# Patient Record
Sex: Female | Born: 1980 | Race: White | Hispanic: No | Marital: Married | Smoking: Never smoker
Health system: Southern US, Community
[De-identification: ages and names within clinical notes are randomized; demographics above are authoritative.]

## PROBLEM LIST (undated history)

## (undated) ENCOUNTER — Inpatient Hospital Stay (HOSPITAL_COMMUNITY): Payer: Self-pay

## (undated) ENCOUNTER — Emergency Department (HOSPITAL_BASED_OUTPATIENT_CLINIC_OR_DEPARTMENT_OTHER): Admission: EM | Payer: 59

## (undated) DIAGNOSIS — R51 Headache: Secondary | ICD-10-CM

## (undated) DIAGNOSIS — R519 Headache, unspecified: Secondary | ICD-10-CM

## (undated) DIAGNOSIS — L509 Urticaria, unspecified: Secondary | ICD-10-CM

## (undated) DIAGNOSIS — M542 Cervicalgia: Secondary | ICD-10-CM

## (undated) DIAGNOSIS — Z9109 Other allergy status, other than to drugs and biological substances: Secondary | ICD-10-CM

## (undated) DIAGNOSIS — I219 Acute myocardial infarction, unspecified: Secondary | ICD-10-CM

## (undated) DIAGNOSIS — J302 Other seasonal allergic rhinitis: Secondary | ICD-10-CM

## (undated) DIAGNOSIS — G8929 Other chronic pain: Secondary | ICD-10-CM

## (undated) DIAGNOSIS — R000 Tachycardia, unspecified: Secondary | ICD-10-CM

## (undated) DIAGNOSIS — M549 Dorsalgia, unspecified: Secondary | ICD-10-CM

## (undated) HISTORY — PX: WISDOM TOOTH EXTRACTION: SHX21

## (undated) HISTORY — DX: Urticaria, unspecified: L50.9

---

## 2008-10-03 ENCOUNTER — Inpatient Hospital Stay (HOSPITAL_COMMUNITY): Admission: AD | Admit: 2008-10-03 | Discharge: 2008-10-03 | Payer: Self-pay | Admitting: Obstetrics and Gynecology

## 2009-02-20 ENCOUNTER — Inpatient Hospital Stay (HOSPITAL_COMMUNITY): Admission: AD | Admit: 2009-02-20 | Discharge: 2009-02-20 | Payer: Self-pay | Admitting: Obstetrics and Gynecology

## 2009-02-22 ENCOUNTER — Inpatient Hospital Stay (HOSPITAL_COMMUNITY): Admission: AD | Admit: 2009-02-22 | Discharge: 2009-02-24 | Payer: Self-pay | Admitting: Obstetrics and Gynecology

## 2011-01-27 LAB — CBC
HCT: 26.1 % — ABNORMAL LOW (ref 36.0–46.0)
HCT: 34.8 % — ABNORMAL LOW (ref 36.0–46.0)
Hemoglobin: 11.9 g/dL — ABNORMAL LOW (ref 12.0–15.0)
Hemoglobin: 12.1 g/dL (ref 12.0–15.0)
MCHC: 34.7 g/dL (ref 30.0–36.0)
MCHC: 35.5 g/dL (ref 30.0–36.0)
MCV: 89.6 fL (ref 78.0–100.0)
MCV: 90.4 fL (ref 78.0–100.0)
RBC: 2.91 MIL/uL — ABNORMAL LOW (ref 3.87–5.11)
RBC: 3.79 MIL/uL — ABNORMAL LOW (ref 3.87–5.11)
RDW: 13.2 % (ref 11.5–15.5)
WBC: 14.1 10*3/uL — ABNORMAL HIGH (ref 4.0–10.5)

## 2011-01-27 LAB — COMPREHENSIVE METABOLIC PANEL
ALT: 21 U/L (ref 0–35)
AST: 26 U/L (ref 0–37)
Alkaline Phosphatase: 122 U/L — ABNORMAL HIGH (ref 39–117)
BUN: 12 mg/dL (ref 6–23)
CO2: 20 mEq/L (ref 19–32)
CO2: 23 mEq/L (ref 19–32)
Calcium: 8.7 mg/dL (ref 8.4–10.5)
Calcium: 9.1 mg/dL (ref 8.4–10.5)
Creatinine, Ser: 0.51 mg/dL (ref 0.4–1.2)
GFR calc Af Amer: >60 mL/min (ref >60)
GFR calc non Af Amer: >60 mL/min (ref >60)
GFR calc non Af Amer: >60 mL/min (ref >60)
Glucose, Bld: 91 mg/dL (ref 70–99)
Glucose, Bld: 96 mg/dL (ref 70–99)
Potassium: 3.9 mEq/L (ref 3.5–5.1)
Sodium: 133 mEq/L — ABNORMAL LOW (ref 135–145)
Sodium: 134 mEq/L — ABNORMAL LOW (ref 135–145)
Total Protein: 6.5 g/dL (ref 6.0–8.3)
Total Protein: 6.7 g/dL (ref 6.0–8.3)

## 2011-01-27 LAB — URIC ACID: Uric Acid, Serum: 6 mg/dL (ref 2.4–7.0)

## 2011-01-27 LAB — LACTATE DEHYDROGENASE
LDH: 122 U/L (ref 94–250)
LDH: 125 U/L (ref 94–250)

## 2011-03-03 NOTE — H&P (Signed)
Stephanie Wagner, Stephanie Wagner              ACCOUNT NO.:  [id number]   MEDICAL RECORD NO.:  [id number]          PATIENT TYPE:  INP   LOCATION:  9169                          FACILITY:  [facility]   PHYSICIAN:  Crist Fat. Rivard, M.D. DATE OF BIRTH:  1981-08-17   DATE OF ADMISSION:  02/22/2009  DATE OF DISCHARGE:                              HISTORY & PHYSICAL   HISTORY:  Ms. Stephanie Wagner is a 30 year old gravida 1, para 0, at 21 weeks who  presents for induction secondary to [facility].  She was seen at the office  today with cervix 3+ centimeters.  She had a pH workup earlier this week  with negative labs.  She declined induction at that time, but consented  today.  Pregnancy has been remarkable for:  1. PIH for the last two weeks.  The patient on labetalol 100 mg p.o.      b.i.d.  2. History of tachycardia with normal cardiac workup.  3. PENICILLIN allergy.  4. Group-B Streptococcus negative.  5. History of previa, resolved after the first trimester.  6. Frequent urinary tract infections.   LABORATORY DATA:  Prenatal labs:  Blood type is  O+, Rh antibody  negative.  VDRL nonreactive.  Rubella titer positive.  Hepatitis-B  surface antigen negative.  HIV is nonreactive.  Toxoplasmosis titers  were negative, negative.  GC and Chlamydia cultures were deferred at the  first trimester.  Pap was normal in March 2009.  TSH was 0.482  initially, with a T4 of 12.2, a T3 of 20.8 and a free thyroid index of  2.5.  This was done in an evaluation of her tachycardia.  She declined a  quadruple screen.  She had a 1-hour Glucola that was normal.  Hemoglobin  at 26 weeks was 10.9.  Group-B strep culture was negative at 36 weeks.  PIH workup was negative earlier this week, and a 24-hour urine is  currently pending.   HISTORY OF PRESENT PREGNANCY:  The patient entered care at 14 weeks.  She did have a first trimester screen that was normal.  She had some  bleeding early in her pregnancy.  She had a placenta previa diagnosed  by  a first trimester ultrasound.  TSH and  toxo-titers were done at her new  OB visit.  TSH was done secondary to some episodes of tachycardia, and  she had some previous evaluations by a cardiologist for that.  She still  had some spotting at 14 weeks.  She declined an H1N1.  She had an  ultrasound at 18 weeks showing normal growth and fluid and an anterior  placenta with a resolution of the previa.  Maternal pulse rate remained  around 100 through her whole pregnancy.  She was on Macrobid prophylaxis  for frequent UTI's.  She had some spotting at 24 weeks.  She was offered  evaluation, but declined.  She had a normal Glucola at 26 weeks and had  a hemoglobin of 10.9.  She had some chest pain at 32 weeks.  This was  evidently more associated with reflux.  Group-B strep culture was done  at 35  weeks and was negative.  She had an elevated blood pressure in the  office earlier this week and was seen in maternity admission. The  cervix that time was 3+, 80%.  PIH workup was within normal limits.  She  was offered induction at that time but she declined.  She then was seen  in the office today, to bring back 24-hour urine, and was off induction  again.  At this point she elected to proceed with induction.   OBSTETRICAL HISTORY:  The patient is a primigravida.   MEDICAL HISTORY:  She is previous __________ user in June 2009.  She  does have one cat. She reports the usual childhood illnesses.  She has  had a cardiologist workup for tachycardia.  All tests have been normal.  She reports anemia as a child.  She has a history of frequent UTI's  right before this pregnancy.   PAST SURGICAL HISTORY:  Wisdom teeth at age 89.   ALLERGIES:  PENICILLIN.   FAMILY HISTORY:  Maternal grandfather had a triple bypass.  Her mother  and maternal aunt and maternal grandmother had varicose veins.  Her  mother has anemia.  Her father has type 2 diabetes, on insulin.  Her  mother is hypothyroid, as well as  her maternal aunt.  Her father had a  kidney transplant.  A paternal uncle also has kidney disease.  Her  mother had breast cancer.  Maternal grandfather had possible testicular  cancer.  Paternal uncle smokes.   GENETIC HISTORY:  Remarkable for father of baby and the father of the  baby's mother having webbed feet.   SOCIAL HISTORY:  The patient is married to the father of baby.  He is  involved and supportive.  His name is [employer].  The patient has a  bachelor's degree.  She is employed as a Teacher, early years/pre.  Her husband is  also Civil Service fast streamer.  He is a Research scientist (physical sciences).  She has  been followed by the physician service of [facility].  She  denies any alcohol, drug or tobacco use during this pregnancy.   PHYSICAL EXAMINATION:  VITAL SIGNS:  Blood pressures 141/98, 139/101  initially, pulse is 101 to 112, respirations of 18.  The patient is  afebrile.  HEENT: Within normal limits.  LUNGS:  Bilateral breath sounds are clear.  HEART:  Regular rate and rhythm without murmur.  BREASTS:  Soft, nontender.  ABDOMEN:  Fundal height is approximately 39 cm.  Estimated fetal weight  7 to 8 pounds.  Uterine contractions every six to eight minutes, mild  quality.  Fetal heart rate is reactive, with no decelerations.  PELVIC EXAM:  Cervix has been 3, 80% vertex, -1 per previous exam.  EXTREMITIES:  Deep tendon reflexes are 2+, without clonus.  There is 1+  edema noted.   IMPRESSION:  1. Intrauterine pregnancy at 39 weeks.  2. Pregnancy-induced hypertension.  3. Favorable cervix.  4. Group-B Streptococcus negative.   PLAN:  1. Admit to birthing suite per consult with Dr. Crist Fat. Rivard as      the attending physician.  2. Routine physician orders.  3. PIH labs are pending with admit labs, and 24-hour urine is also      pending from the office today.  4. Pitocin per low-dose protocol.  5. Dr. Estanislado Pandy will follow.  6. Pain medication p.r.n.     Renaldo Reel  Emilee Hero, C.N.M.      Crist Fat Rivard, M.D.  Electronically  Signed   VLL/MEDQ  D:  02/22/2009  T:  02/22/2009  Job:  [id number]

## 2011-07-24 LAB — URINALYSIS, ROUTINE W REFLEX MICROSCOPIC
Bilirubin Urine: NEGATIVE
Glucose, UA: NEGATIVE mg/dL
Ketones, ur: NEGATIVE mg/dL
Protein, ur: NEGATIVE mg/dL
pH: 6 (ref 5.0–8.0)

## 2011-07-24 LAB — URINE MICROSCOPIC-ADD ON

## 2011-07-24 LAB — WET PREP, GENITAL
Trich, Wet Prep: NONE SEEN
Yeast Wet Prep HPF POC: NONE SEEN

## 2011-07-24 LAB — GC/CHLAMYDIA PROBE AMP, GENITAL
Chlamydia, DNA Probe: NEGATIVE
GC Probe Amp, Genital: NEGATIVE

## 2011-07-24 LAB — URINE CULTURE: Culture: NO GROWTH

## 2011-11-08 ENCOUNTER — Encounter (HOSPITAL_BASED_OUTPATIENT_CLINIC_OR_DEPARTMENT_OTHER): Payer: Self-pay | Admitting: *Deleted

## 2011-11-08 ENCOUNTER — Emergency Department (HOSPITAL_BASED_OUTPATIENT_CLINIC_OR_DEPARTMENT_OTHER)
Admission: EM | Admit: 2011-11-08 | Discharge: 2011-11-08 | Disposition: A | Discharge status: Home / Self Care | Payer: BC Managed Care – PPO | Attending: Emergency Medicine | Admitting: Emergency Medicine

## 2011-11-08 ENCOUNTER — Other Ambulatory Visit: Payer: Self-pay

## 2011-11-08 DIAGNOSIS — G43909 Migraine, unspecified, not intractable, without status migrainosus: Secondary | ICD-10-CM | POA: Insufficient documentation

## 2011-11-08 DIAGNOSIS — IMO0001 Reserved for inherently not codable concepts without codable children: Secondary | ICD-10-CM | POA: Insufficient documentation

## 2011-11-08 DIAGNOSIS — R059 Cough, unspecified: Secondary | ICD-10-CM | POA: Insufficient documentation

## 2011-11-08 DIAGNOSIS — B349 Viral infection, unspecified: Secondary | ICD-10-CM

## 2011-11-08 DIAGNOSIS — M549 Dorsalgia, unspecified: Secondary | ICD-10-CM | POA: Insufficient documentation

## 2011-11-08 DIAGNOSIS — R000 Tachycardia, unspecified: Secondary | ICD-10-CM | POA: Insufficient documentation

## 2011-11-08 DIAGNOSIS — R05 Cough: Secondary | ICD-10-CM | POA: Insufficient documentation

## 2011-11-08 DIAGNOSIS — R197 Diarrhea, unspecified: Secondary | ICD-10-CM | POA: Insufficient documentation

## 2011-11-08 DIAGNOSIS — M542 Cervicalgia: Secondary | ICD-10-CM | POA: Insufficient documentation

## 2011-11-08 HISTORY — DX: Tachycardia, unspecified: R00.0

## 2011-11-08 HISTORY — DX: Headache, unspecified: R51.9

## 2011-11-08 HISTORY — DX: Headache: R51

## 2011-11-08 HISTORY — DX: Cervicalgia: M54.2

## 2011-11-08 HISTORY — DX: Dorsalgia, unspecified: M54.9

## 2011-11-08 HISTORY — DX: Other chronic pain: G89.29

## 2011-11-08 LAB — COMPREHENSIVE METABOLIC PANEL
AST: 23 U/L (ref 0–37)
Albumin: 4.3 g/dL (ref 3.5–5.2)
Alkaline Phosphatase: 85 U/L (ref 39–117)
BUN: 11 mg/dL (ref 6–23)
CO2: 22 mEq/L (ref 19–32)
Chloride: 103 mEq/L (ref 96–112)
GFR calc non Af Amer: >90 mL/min (ref >90)
Potassium: 4.6 mEq/L (ref 3.5–5.1)
Total Bilirubin: 0.2 mg/dL — ABNORMAL LOW (ref 0.3–1.2)

## 2011-11-08 LAB — DIFFERENTIAL
Basophils Relative: 0 % (ref 0–1)
Lymphocytes Relative: 11 % — ABNORMAL LOW (ref 12–46)
Lymphs Abs: 0.6 10*3/uL — ABNORMAL LOW (ref 0.7–4.0)
Monocytes Relative: 1 % — ABNORMAL LOW (ref 3–12)
Neutro Abs: 4.9 10*3/uL (ref 1.7–7.7)
Neutrophils Relative %: 88 % — ABNORMAL HIGH (ref 43–77)

## 2011-11-08 LAB — CBC
HCT: 39.4 % (ref 36.0–46.0)
Hemoglobin: 13.6 g/dL (ref 12.0–15.0)
MCHC: 34.5 g/dL (ref 30.0–36.0)
RBC: 4.77 MIL/uL (ref 3.87–5.11)
WBC: 5.6 10*3/uL (ref 4.0–10.5)

## 2011-11-08 LAB — URINALYSIS, ROUTINE W REFLEX MICROSCOPIC
Glucose, UA: NEGATIVE mg/dL
Hgb urine dipstick: NEGATIVE
Ketones, ur: NEGATIVE mg/dL
Protein, ur: NEGATIVE mg/dL

## 2011-11-08 MED ORDER — PROMETHAZINE HCL 25 MG/ML IJ SOLN
25.0000 mg | Freq: Once | INTRAMUSCULAR | Status: AC
  Administered 2011-11-08: 25 mg via INTRAVENOUS
  Filled 2011-11-08: qty 1

## 2011-11-08 MED ORDER — LORAZEPAM 2 MG/ML IJ SOLN
0.5000 mg | Freq: Once | INTRAMUSCULAR | Status: AC
  Administered 2011-11-08: 0.5 mg via INTRAVENOUS
  Filled 2011-11-08: qty 1

## 2011-11-08 MED ORDER — DIPHENHYDRAMINE HCL 50 MG/ML IJ SOLN
12.5000 mg | Freq: Once | INTRAMUSCULAR | Status: AC
  Administered 2011-11-08: 12.5 mg via INTRAVENOUS
  Filled 2011-11-08: qty 1

## 2011-11-08 MED ORDER — METHYLPREDNISOLONE SODIUM SUCC 125 MG IJ SOLR
125.0000 mg | Freq: Once | INTRAMUSCULAR | Status: AC
  Administered 2011-11-08: 125 mg via INTRAVENOUS
  Filled 2011-11-08: qty 2

## 2011-11-08 MED ORDER — HYDROMORPHONE HCL PF 1 MG/ML IJ SOLN
1.0000 mg | Freq: Once | INTRAMUSCULAR | Status: AC
  Administered 2011-11-08: 1 mg via INTRAVENOUS
  Filled 2011-11-08: qty 1

## 2011-11-08 NOTE — ED Provider Notes (Signed)
History   This chart was scribed for Stephanie Quarry, MD by Melba Coon. The patient was seen in room MH06/MH06 and the patient's care was started at 7:25PM.    CSN: [id number]  Arrival date & time 11/08/11  4098   First MD Initiated Contact with Patient 11/08/11 1904      Chief Complaint  Patient presents with  . Tachycardia    (Consider location/radiation/quality/duration/timing/severity/associated sxs/prior treatment) HPI Stephanie Wagner is a 31 y.o. female who presents to the Emergency Department complaining of constant moderate to severe cold-like symptoms with an onset 3 days ago. Pt was afraid it might be the flu because she has a daughter at home. Since onset, pt has had neck pain, back pain, headaches due to neck pain, chills, sweats, nausea, diarrhea, mild stomach discomfort, nasal congestion, and mild cough. This morning (10:30AM), pt went to be tested for flu at a clinic; results were negative but HR at clinic was tachycardic (120). Pt was sent home. However, pt still did not feel well so she presented to the ED. Laying down does not alleviate symptoms. Nml fluid intake. No fever or vomit. Hx of fibromyalgia.    Past Medical History  Diagnosis Date  . Tachycardia   . Migraine   . Back pain   . Neck pain   . Chronic headaches     History reviewed. No pertinent past surgical history.  No family history on file.  History  Substance Use Topics  . Smoking status: Never Smoker   . Smokeless tobacco: Never Used  . Alcohol Use: No    OB History    Grav Para Term Preterm Abortions TAB SAB Ect Mult Living                  Review of Systems 10 Systems reviewed and are negative for acute change except as noted in the HPI.  Allergies  Penicillins  Home Medications   Current Outpatient Rx  Name Route Sig Dispense Refill  . AZITHROMYCIN 250 MG PO TABS Oral Take 250 mg by mouth daily.      BP 134/90  Pulse 126  Temp(Src) 98.6 F (37 C) (Oral)  Resp 16   Ht 5\' 8"  (1.727 m)  Wt 143 lb (64.864 kg)  BMI 21.74 kg/m2  SpO2 100%  LMP 10/25/2011  Physical Exam  Constitutional: She is oriented to person, place, and time. She appears well-developed and well-nourished.  HENT:  Head: Normocephalic and atraumatic.  Mouth/Throat: Mucous membranes are dry.       oropharynx clear  Eyes: Conjunctivae and EOM are normal. Pupils are equal, round, and reactive to light. No scleral icterus.  Neck: Normal range of motion. Neck supple. No thyromegaly present.  Cardiovascular: Normal rate and regular rhythm.  Exam reveals no gallop and no friction rub.   No murmur heard. Pulmonary/Chest: No stridor. She has no wheezes. She has no rales. She exhibits no tenderness.  Abdominal: Soft. She exhibits no distension. There is no tenderness. There is no rebound.  Musculoskeletal: Normal range of motion. She exhibits no edema.  Lymphadenopathy:    She has no cervical adenopathy.  Neurological: She is alert and oriented to person, place, and time. Coordination normal.  Skin: Skin is warm. No rash noted. No erythema.  Psychiatric: She has a normal mood and affect. Her behavior is normal.    ED Course  Procedures (including critical care time)  DIAGNOSTIC STUDIES: Oxygen Saturation is 100% on room air, normal by my interpretation.  COORDINATION OF CARE:  Results for orders placed during the hospital encounter of 11/08/11  CBC      Component Value Range   WBC 5.6  4.0 - 10.5 (K/uL)   RBC 4.77  3.87 - 5.11 (MIL/uL)   Hemoglobin 13.6  12.0 - 15.0 (g/dL)   HCT 09.8  11.9 - 14.7 (%)   MCV 82.6  78.0 - 100.0 (fL)   MCH 28.5  26.0 - 34.0 (pg)   MCHC 34.5  30.0 - 36.0 (g/dL)   RDW 82.9  56.2 - 13.0 (%)   Platelets 363  150 - 400 (K/uL)  DIFFERENTIAL      Component Value Range   Neutrophils Relative 88 (*) 43 - 77 (%)   Neutro Abs 4.9  1.7 - 7.7 (K/uL)   Lymphocytes Relative 11 (*) 12 - 46 (%)   Lymphs Abs 0.6 (*) 0.7 - 4.0 (K/uL)   Monocytes Relative 1 (*)  3 - 12 (%)   Monocytes Absolute 0.1  0.1 - 1.0 (K/uL)   Eosinophils Relative 0  0 - 5 (%)   Eosinophils Absolute 0.0  0.0 - 0.7 (K/uL)   Basophils Relative 0  0 - 1 (%)   Basophils Absolute 0.0  0.0 - 0.1 (K/uL)  COMPREHENSIVE METABOLIC PANEL      Component Value Range   Sodium 137  135 - 145 (mEq/L)   Potassium 4.6  3.5 - 5.1 (mEq/L)   Chloride 103  96 - 112 (mEq/L)   CO2 22  19 - 32 (mEq/L)   Glucose, Bld 202 (*) 70 - 99 (mg/dL)   BUN 11  6 - 23 (mg/dL)   Creatinine, Ser 8.65  0.50 - 1.10 (mg/dL)   Calcium 9.8  8.4 - 78.4 (mg/dL)   Total Protein 8.2  6.0 - 8.3 (g/dL)   Albumin 4.3  3.5 - 5.2 (g/dL)   AST 23  0 - 37 (U/L)   ALT 18  0 - 35 (U/L)   Alkaline Phosphatase 85  39 - 117 (U/L)   Total Bilirubin 0.2 (*) 0.3 - 1.2 (mg/dL)   GFR calc non Af Amer >90  >90 (mL/min)   GFR calc Af Amer >90  >90 (mL/min)  URINALYSIS, ROUTINE W REFLEX MICROSCOPIC      Component Value Range   Color, Urine YELLOW  YELLOW    APPearance CLEAR  CLEAR    Specific Gravity, Urine 1.005  1.005 - 1.030    pH 7.0  5.0 - 8.0    Glucose, UA NEGATIVE  NEGATIVE (mg/dL)   Hgb urine dipstick NEGATIVE  NEGATIVE    Bilirubin Urine NEGATIVE  NEGATIVE    Ketones, ur NEGATIVE  NEGATIVE (mg/dL)   Protein, ur NEGATIVE  NEGATIVE (mg/dL)   Urobilinogen, UA 0.2  0.0 - 1.0 (mg/dL)   Nitrite NEGATIVE  NEGATIVE    Leukocytes, UA TRACE (*) NEGATIVE   URINE MICROSCOPIC-ADD ON      Component Value Range   Squamous Epithelial / LPF RARE  RARE    WBC, UA 0-2  <3 (WBC/hpf)   Bacteria, UA RARE  RARE        No results found.   No diagnosis found.    MDM  Patient given 1 L of normal saline and heart rate has decreased to 100. She has not had a vomiting here. She received 0.5 mg of Ativan at a milligram Dilaudid and her headache has improved.  I personally performed the services described in this documentation, which was  scribed in my presence. The recorded information has been reviewed and  considered.        Stephanie Quarry, MD 11/08/11 2227

## 2011-11-08 NOTE — ED Notes (Signed)
Pt reports she was seen at walk-in clinic today and had negative flu screen- started on z-pack and also received "steroid shot"- HR 120s while at walk in clinic- states just pta she started feeling lightheaded and dizzy- felt heart "beating really fast and hard"- tried vagal manuevers to slow it down which did not help so pt drove to ED

## 2011-11-08 NOTE — ED Notes (Signed)
EKG given to EDP Ray to review

## 2011-11-08 NOTE — ED Notes (Signed)
Pt placed on bedpan

## 2011-11-08 NOTE — ED Notes (Signed)
Pt reports she has "felt sick" x 3 days- reports rapid heart rate since 1000- has hx of same

## 2011-11-14 ENCOUNTER — Emergency Department (HOSPITAL_BASED_OUTPATIENT_CLINIC_OR_DEPARTMENT_OTHER)
Admission: EM | Admit: 2011-11-14 | Discharge: 2011-11-15 | Disposition: A | Discharge status: Home / Self Care | Payer: BC Managed Care – PPO | Attending: Emergency Medicine | Admitting: Emergency Medicine

## 2011-11-14 ENCOUNTER — Encounter (HOSPITAL_BASED_OUTPATIENT_CLINIC_OR_DEPARTMENT_OTHER): Payer: Self-pay | Admitting: *Deleted

## 2011-11-14 DIAGNOSIS — Z79899 Other long term (current) drug therapy: Secondary | ICD-10-CM | POA: Insufficient documentation

## 2011-11-14 DIAGNOSIS — R51 Headache: Secondary | ICD-10-CM | POA: Insufficient documentation

## 2011-11-14 MED ORDER — KETOROLAC TROMETHAMINE 30 MG/ML IJ SOLN
30.0000 mg | Freq: Once | INTRAMUSCULAR | Status: AC
Start: 2011-11-14 — End: 2011-11-14
  Administered 2011-11-14: 30 mg via INTRAVENOUS
  Filled 2011-11-14: qty 1

## 2011-11-14 MED ORDER — METOCLOPRAMIDE HCL 5 MG/ML IJ SOLN
10.0000 mg | Freq: Once | INTRAMUSCULAR | Status: AC
  Administered 2011-11-14: 10 mg via INTRAVENOUS
  Filled 2011-11-14: qty 2

## 2011-11-14 MED ORDER — PROMETHAZINE HCL 25 MG/ML IJ SOLN
25.0000 mg | Freq: Once | INTRAMUSCULAR | Status: AC
  Administered 2011-11-14: 25 mg via INTRAVENOUS
  Filled 2011-11-14: qty 1

## 2011-11-14 NOTE — ED Notes (Signed)
Pt was seen here last Sunday with a pulse of 160, which led to a migraine. Monday saw GP. Placed on Metoprolol. Took first dose Wed. Lowered BP too much, which again led to "wosre H/A of her life. Will see cardiologist on Monday. Has had a lot of stress and anxiety lately.. This am felt better, but now has an even worse H/A over left eye. PERL

## 2011-11-14 NOTE — ED Provider Notes (Signed)
History     CSN: [id number]  Arrival date & time 11/14/11  1945   First MD Initiated Contact with Patient 11/14/11 2252      Chief Complaint  Patient presents with  . Headache    (Consider location/radiation/quality/duration/timing/severity/associated sxs/prior treatment) HPI Comments: 31 year old female with a history of migraines since a child, who presents to the emergency department with her mother who gives all the history since the patient is unable to speak due to the severity of her headache. She states that approximately 24 hours ago she developed a recurrent headache. This is left-sided, frontal and behind the left eye, throbbing, severe and associated with nausea. She was seen approximately one week ago and treated for headache with good resolution of her symptoms. She was noted to be tachycardic at that time and was sent to followup with her family doctor who started her on a beta blocker on Wednesday, this caused a drop in her blood pressure causing a rebound headache and she was instructed to stop the beta blocker and scheduled for a followup appointment with cardiology. She has not yet seen a cardiologist. She states that while shopping at the grocery store she developed numbness and tingling in her arms and legs, a feeling of palpitations and anxiety. The headache that started after that yesterday. It has been persistent. She denies fevers, chills, stiff neck, focal numbness or weakness at this time. She states that in the past has been worked up with CAT scans, MRIs and other workup without definitive etiology. She also states that she was recently diagnosed with fibromyalgia by her doctor  Patient is a 31 y.o. female presenting with headaches. The history is provided by the patient, a parent and medical records.  Headache     Past Medical History  Diagnosis Date  . Tachycardia   . Migraine   . Back pain   . Neck pain   . Chronic headaches     History reviewed. No  pertinent past surgical history.  History reviewed. No pertinent family history.  History  Substance Use Topics  . Smoking status: Never Smoker   . Smokeless tobacco: Never Used  . Alcohol Use: No    OB History    Grav Para Term Preterm Abortions TAB SAB Ect Mult Living                  Review of Systems  Neurological: Positive for headaches.  All other systems reviewed and are negative.    Allergies  Penicillins  Home Medications   Current Outpatient Rx  Name Route Sig Dispense Refill  . IBUPROFEN-DIPHENHYDRAMINE CIT 200-38 MG PO TABS Oral Take 2 tablets by mouth at bedtime as needed. For pain and sleep    . LEVONORGESTREL-ETHINYL ESTRAD 0.1-20 MG-MCG PO TABS Oral Take 1 tablet by mouth daily.    Marland Kitchen METOPROLOL TARTRATE 25 MG PO TABS Oral Take 25 mg by mouth 2 (two) times daily.    Marland Kitchen HYDROCODONE-ACETAMINOPHEN 5-500 MG PO TABS Oral Take 1 tablet by mouth every 6 (six) hours as needed for pain. 30 tablet 0  . NAPROXEN 500 MG PO TABS Oral Take 1 tablet (500 mg total) by mouth 2 (two) times daily. 30 tablet 0    BP 126/90  Pulse 95  Temp(Src) 98.1 F (36.7 C) (Oral)  Resp 16  Ht 5\' 8"  (1.727 m)  Wt 145 lb (65.772 kg)  BMI 22.05 kg/m2  SpO2 100%  LMP 10/25/2011  Physical Exam  Nursing note and  vitals reviewed. Constitutional: She appears well-developed and well-nourished.       Uncomfortable appearing  HENT:  Head: Normocephalic and atraumatic.  Mouth/Throat: Oropharynx is clear and moist. No oropharyngeal exudate.  Eyes: Conjunctivae and EOM are normal. Pupils are equal, round, and reactive to light. Right eye exhibits no discharge. Left eye exhibits no discharge. No scleral icterus.  Neck: Normal range of motion. Neck supple. No JVD present. No thyromegaly present.  Cardiovascular: Regular rhythm, normal heart sounds and intact distal pulses.  Exam reveals no gallop and no friction rub.   No murmur heard.      Tachycardic to 110  Pulmonary/Chest: Effort normal  and breath sounds normal. No respiratory distress. She has no wheezes. She has no rales.  Abdominal: Soft. Bowel sounds are normal. She exhibits no distension and no mass. There is no tenderness.  Musculoskeletal: Normal range of motion. She exhibits no edema and no tenderness.  Lymphadenopathy:    She has no cervical adenopathy.  Neurological: She is alert. Coordination normal.       Patient moves all extremities x4, speech is quiet but appropriate, alert and oriented x4. No focal numbness or weakness of the extremities or face. Cranial nerves III through XII intact  Skin: Skin is warm and dry. No rash noted. No erythema.  Psychiatric: She has a normal mood and affect. Her behavior is normal.    ED Course  Procedures (including critical care time)  Labs Reviewed - No data to display No results found.   1. Headache       MDM  Specifically the patient does not complain of feeling anxious, she has no chest pain is no leg swelling, immobility, trauma or recent surgeries. Currently her blood pressure is 127/86, respirations are 16 and unlabored and oxygen level is 100% on room air. Pulse is currently 100-110. She states that she does take Advil and Benadryl at night for sleep. The patient does admit to having chronic intermittent headaches. There is no reason to believe that this is more for pathologic headache at this time she does have a normal neurologic exam. Will treatment for migraine cocktail including intravenous Toradol and with Phenergan, then reevaluation.  According to the medical record from her last visit she was treated with Dilantin, Phenergan, Decadron with good improvement in her last visit.  Reevaluation of the patient after above listed intervention showed minimal improvement of the headache to only 8/10. I ordered intravenous dye lot at 1 mg with significant improvement. Patient states after the medication she is almost completely symptom-free. I discussed with the  patient and her family members the need for further followup including with family doctors and a neurologist for headache specific care. She has agreed to followup as indicated. On discharge her heart rate was only 105.          Vida Roller, MD 11/15/11 [phone]

## 2011-11-15 MED ORDER — HYDROCODONE-ACETAMINOPHEN 5-500 MG PO TABS: 1.0000 | ORAL_TABLET | Freq: Four times a day (QID) | ORAL | Status: AC | PRN

## 2011-11-15 MED ORDER — NAPROXEN 500 MG PO TABS: 500.0000 mg | ORAL_TABLET | Freq: Two times a day (BID) | ORAL | Status: DC

## 2011-11-15 MED ORDER — HYDROMORPHONE HCL PF 1 MG/ML IJ SOLN
1.0000 mg | Freq: Once | INTRAMUSCULAR | Status: AC
Start: 2011-11-15 — End: 2011-11-15
  Administered 2011-11-15: 1 mg via INTRAVENOUS
  Filled 2011-11-15: qty 1

## 2012-01-29 ENCOUNTER — Telehealth: Payer: Self-pay

## 2012-01-29 MED ORDER — NORETHINDRONE ACET-ETHINYL EST 1-20 MG-MCG PO TABS: 1.0000 | ORAL_TABLET | Freq: Every day | ORAL | Status: DC

## 2012-01-29 NOTE — Telephone Encounter (Signed)
Pt notified Rx sent in  ld

## 2012-02-03 NOTE — Telephone Encounter (Signed)
rx done

## 2012-02-24 ENCOUNTER — Encounter: Payer: Self-pay | Admitting: Obstetrics and Gynecology

## 2012-02-24 ENCOUNTER — Ambulatory Visit (INDEPENDENT_AMBULATORY_CARE_PROVIDER_SITE_OTHER): Payer: BC Managed Care – PPO | Admitting: Obstetrics and Gynecology

## 2012-02-24 VITALS — BP 102/62 | Wt 142.0 lb

## 2012-02-24 DIAGNOSIS — N943 Premenstrual tension syndrome: Secondary | ICD-10-CM

## 2012-02-24 NOTE — Progress Notes (Signed)
S: 31 yo c/o a lot of weird symptoms with onset at cycle with anxiety, hot flashes, feeling like in a dream, palpitations. Symptoms resolved at the end of period.      Seen 07/2011 with normal AEX. TSH normal      Cycle monthly, lasting 5 days, normal flow currently on Lessina (levonogestrel/EE). Just changed back to Loestrin because of increased headaches.  O: deferred  A: Worsening PMS?  P: Loestrin trial x 3 months with daily Evening Primrose oil. If not better will do hormonal panel

## 2012-02-24 NOTE — Progress Notes (Deleted)
cm

## 2012-03-16 ENCOUNTER — Telehealth: Payer: Self-pay

## 2012-03-16 NOTE — Telephone Encounter (Signed)
Pt states that you switched her bcp at last visit, thinking it was the cause of her "wierd symptoms". Pt symptoms are continuing and some worse.  C/o HA, night sweats, anxious feeling and nausea.  LMP  02-11-12.  States she has had increased stress, but denies panic attacks. Is OOT now but does she need a return ov?

## 2012-03-16 NOTE — Telephone Encounter (Signed)
Have pt come in for TSH and prolactin. If results are normal, I would recommend a trial of topical progesterone without any further changes in BCP

## 2012-03-16 NOTE — Telephone Encounter (Signed)
Pt to have prolactin and TSH done when returns to town.  Pt to call for appt or call ld directly.  If normal, trial of topical prog per SR.  Pt agreeable.  ld

## 2012-03-29 ENCOUNTER — Telehealth: Payer: Self-pay | Admitting: Obstetrics and Gynecology

## 2012-03-29 NOTE — Telephone Encounter (Signed)
Laura/SR pt/epic

## 2012-03-29 NOTE — Telephone Encounter (Signed)
lmtc ld 

## 2012-03-31 ENCOUNTER — Other Ambulatory Visit: Payer: BC Managed Care – PPO

## 2012-03-31 ENCOUNTER — Other Ambulatory Visit: Payer: Self-pay

## 2012-03-31 DIAGNOSIS — N943 Premenstrual tension syndrome: Secondary | ICD-10-CM

## 2012-04-05 ENCOUNTER — Telehealth: Payer: Self-pay | Admitting: Obstetrics and Gynecology

## 2012-04-05 NOTE — Telephone Encounter (Signed)
Triage/res.electronic

## 2012-04-05 NOTE — Telephone Encounter (Signed)
Advised pt that lab results were in. Waiting for Dr. Estanislado Pandy to sign off and give recommendations and then someone would return call. Pt voice understanding

## 2012-04-10 ENCOUNTER — Emergency Department (HOSPITAL_BASED_OUTPATIENT_CLINIC_OR_DEPARTMENT_OTHER)
Admission: EM | Admit: 2012-04-10 | Discharge: 2012-04-11 | Disposition: A | Discharge status: Home / Self Care | Payer: BC Managed Care – PPO | Attending: Emergency Medicine | Admitting: Emergency Medicine

## 2012-04-10 ENCOUNTER — Encounter (HOSPITAL_BASED_OUTPATIENT_CLINIC_OR_DEPARTMENT_OTHER): Payer: Self-pay | Admitting: *Deleted

## 2012-04-10 DIAGNOSIS — Z880 Allergy status to penicillin: Secondary | ICD-10-CM | POA: Insufficient documentation

## 2012-04-10 DIAGNOSIS — G43909 Migraine, unspecified, not intractable, without status migrainosus: Secondary | ICD-10-CM

## 2012-04-10 DIAGNOSIS — Z79899 Other long term (current) drug therapy: Secondary | ICD-10-CM | POA: Insufficient documentation

## 2012-04-10 MED ORDER — DIPHENHYDRAMINE HCL 50 MG/ML IJ SOLN
25.0000 mg | Freq: Once | INTRAMUSCULAR | Status: AC
Start: 2012-04-10 — End: 2012-04-10
  Administered 2012-04-10: 25 mg via INTRAVENOUS
  Filled 2012-04-10: qty 1

## 2012-04-10 MED ORDER — DEXAMETHASONE SODIUM PHOSPHATE 10 MG/ML IJ SOLN
10.0000 mg | Freq: Once | INTRAMUSCULAR | Status: AC
  Administered 2012-04-10: 10 mg via INTRAVENOUS
  Filled 2012-04-10: qty 1

## 2012-04-10 MED ORDER — SODIUM CHLORIDE 0.9 % IV BOLUS (SEPSIS)
1000.0000 mL | Freq: Once | INTRAVENOUS | Status: AC
  Administered 2012-04-10 (×2): 1000 mL via INTRAVENOUS

## 2012-04-10 MED ORDER — METOCLOPRAMIDE HCL 5 MG/ML IJ SOLN
10.0000 mg | Freq: Once | INTRAMUSCULAR | Status: AC
  Administered 2012-04-10: 10 mg via INTRAVENOUS
  Filled 2012-04-10: qty 2

## 2012-04-10 NOTE — ED Notes (Signed)
Pt reports a gradual onset of severe pain to the top of head and behind her eyes. Pt reports nausea and generalized weakness.  Pt denies any other associated trauma. Pt states that this pain is different than her usually migraines.

## 2012-04-10 NOTE — ED Notes (Signed)
Pt has hx of migraines and this one started at 3p. +nausea

## 2012-04-10 NOTE — ED Provider Notes (Signed)
History   This chart was scribed for Rolan Bucco, MD by Charolett Bumpers . The patient was seen in room MH03/MH03.    CSN: [id number]  Arrival date & time 04/10/12  2044   First MD Initiated Contact with Patient 04/10/12 2154      Chief Complaint  Patient presents with  . Migraine    (Consider location/radiation/quality/duration/timing/severity/associated sxs/prior treatment) HPI Stephanie Wagner is a 31 y.o. female who presents to the Emergency Department complaining of constant, moderate to severe headache with associated nausea since 2 pm today. Last night, felt some head pain, but states that she felt normal this morning. Patient states that she felt dizzy with the onset of the headache. Patient states that her headache is located around her temples, the top of her head and behind eyes. Patient reports a h/o migraines. Patient states that she gets them monthly with her menstrual period. Patient states that she finished her menstrual period yesterday. Patient states that the current headache is more intense, but the same type of pain as she normally has with her migraine. Patient states that she feels that this is one of her typical type headaches, just more intense. Patient states that she is not on any medications for migraines. Patient reports that she took Vicodin and Advil with no relief. No fevers, vomiting, neck stiffness, or recent trauma  Past Medical History  Diagnosis Date  . Tachycardia   . Migraine   . Back pain   . Neck pain   . Chronic headaches     History reviewed. No pertinent past surgical history.  Family History  Problem Relation Age of Onset  . Breast cancer Maternal Grandmother   . Diabetes Father   .       History  Substance Use Topics  . Smoking status: Never Smoker   . Smokeless tobacco: Never Used  . Alcohol Use: No    OB History    Grav Para Term Preterm Abortions TAB SAB Ect Mult Living   1 1 1       1       Review of Systems    Constitutional: Negative for fever.  HENT: Negative for congestion, sore throat, rhinorrhea and neck pain.   Respiratory: Negative for cough.   Gastrointestinal: Positive for nausea. Negative for vomiting and abdominal pain.  Genitourinary: Negative for dysuria.  Skin: Negative for rash.  Neurological: Positive for dizziness and headaches.  All other systems reviewed and are negative.    Allergies  Penicillins  Home Medications   Current Outpatient Rx  Name Route Sig Dispense Refill  . VICODIN PO Oral Take 1 tablet by mouth daily as needed. Patient used this medication with the advil today for her headache.    . IBUPROFEN 200 MG PO TABS Oral Take 400 mg by mouth every 6 (six) hours as needed. Patient used this medication for her headache.    . NORETHINDRONE ACET-ETHINYL EST 1-20 MG-MCG PO TABS Oral Take 1 tablet by mouth daily. 1 Package 11  . IBUPROFEN-DIPHENHYDRAMINE CIT 200-38 MG PO TABS Oral Take 2 tablets by mouth at bedtime as needed. For pain and sleep      BP 163/110  Pulse 118  Temp 98.3 F (36.8 C) (Oral)  Resp 20  Ht 5\' 8"  (1.727 m)  Wt 142 lb (64.411 kg)  BMI 21.59 kg/m2  SpO2 96%  LMP 04/09/2012  Physical Exam  Nursing note and vitals reviewed. Constitutional: She is oriented to person, place, and time. She  appears well-developed and well-nourished. No distress.  HENT:  Head: Normocephalic and atraumatic.  Eyes: EOM are normal. Pupils are equal, round, and reactive to light.       Unable to visualize fundoscopic exam. No photophobia.   Neck: Normal range of motion. Neck supple. No tracheal deviation present.       No meningeal signs.   Cardiovascular: Normal rate.   Pulmonary/Chest: Effort normal. No respiratory distress.  Abdominal: Soft. She exhibits no distension.  Musculoskeletal: Normal range of motion. She exhibits no edema.  Lymphadenopathy:    She has no cervical adenopathy.  Neurological: She is alert and oriented to person, place, and time.  She has normal strength. No cranial nerve deficit or sensory deficit. Coordination normal. GCS eye subscore is 4. GCS verbal subscore is 5. GCS motor subscore is 6.       Finger nose test normal. Cranial nerves intact.   Skin: Skin is warm and dry.  Psychiatric: She has a normal mood and affect. Her behavior is normal.    ED Course  Procedures (including critical care time)  DIAGNOSTIC STUDIES: Oxygen Saturation is 96% on room air, adequate by my interpretation.    COORDINATION OF CARE:   2219: Discussed planned course of treatment with the patient, who is agreeable at this time.     Labs Reviewed - No data to display No results found.   1. Migraine       MDM  Pt with pain typical of her past headaches.  Denies unusual symptoms to suggest SAH, meningitis.  Will tx symptoms, refer to outpt f/u  I personally performed the services described in this documentation, which was scribed in my presence.  The recorded information has been reviewed and considered.        Rolan Bucco, MD 04/10/12 [phone]

## 2012-04-11 ENCOUNTER — Telehealth: Payer: Self-pay | Admitting: Obstetrics and Gynecology

## 2012-04-11 NOTE — Telephone Encounter (Signed)
Triage/epic/elect. res

## 2012-04-13 ENCOUNTER — Telehealth: Payer: Self-pay | Admitting: Obstetrics and Gynecology

## 2012-04-13 ENCOUNTER — Encounter (HOSPITAL_BASED_OUTPATIENT_CLINIC_OR_DEPARTMENT_OTHER): Payer: Self-pay | Admitting: *Deleted

## 2012-04-13 ENCOUNTER — Emergency Department (HOSPITAL_BASED_OUTPATIENT_CLINIC_OR_DEPARTMENT_OTHER)
Admission: EM | Admit: 2012-04-13 | Discharge: 2012-04-13 | Disposition: A | Discharge status: Home / Self Care | Payer: BC Managed Care – PPO | Attending: Emergency Medicine | Admitting: Emergency Medicine

## 2012-04-13 DIAGNOSIS — G43909 Migraine, unspecified, not intractable, without status migrainosus: Secondary | ICD-10-CM

## 2012-04-13 DIAGNOSIS — Z79899 Other long term (current) drug therapy: Secondary | ICD-10-CM | POA: Insufficient documentation

## 2012-04-13 LAB — PREGNANCY, URINE: Preg Test, Ur: NEGATIVE

## 2012-04-13 MED ORDER — HYDROCODONE-ACETAMINOPHEN 5-325 MG PO TABS: 1.0000 | ORAL_TABLET | ORAL | Status: AC | PRN

## 2012-04-13 MED ORDER — KETOROLAC TROMETHAMINE 30 MG/ML IJ SOLN: 30.0000 mg | Freq: Once | INTRAMUSCULAR | Status: DC

## 2012-04-13 MED ORDER — SODIUM CHLORIDE 0.9 % IV BOLUS (SEPSIS)
1000.0000 mL | Freq: Once | INTRAVENOUS | Status: AC
  Administered 2012-04-13: 1000 mL via INTRAVENOUS

## 2012-04-13 MED ORDER — KETOROLAC TROMETHAMINE 30 MG/ML IJ SOLN
30.0000 mg | Freq: Once | INTRAMUSCULAR | Status: AC
  Administered 2012-04-13: 30 mg via INTRAVENOUS
  Filled 2012-04-13: qty 1

## 2012-04-13 MED ORDER — IBUPROFEN 600 MG PO TABS: 600.0000 mg | ORAL_TABLET | Freq: Four times a day (QID) | ORAL | Status: DC | PRN

## 2012-04-13 MED ORDER — MORPHINE SULFATE 4 MG/ML IJ SOLN
4.0000 mg | Freq: Once | INTRAMUSCULAR | Status: AC
  Administered 2012-04-13: 4 mg via INTRAVENOUS
  Filled 2012-04-13: qty 1

## 2012-04-13 MED ORDER — METOCLOPRAMIDE HCL 10 MG PO TABS: 10.0000 mg | ORAL_TABLET | Freq: Four times a day (QID) | ORAL | Status: DC

## 2012-04-13 MED ORDER — METOCLOPRAMIDE HCL 5 MG/ML IJ SOLN
10.0000 mg | Freq: Once | INTRAMUSCULAR | Status: AC
  Administered 2012-04-13: 10 mg via INTRAVENOUS
  Filled 2012-04-13: qty 2

## 2012-04-13 NOTE — ED Notes (Signed)
Pt seen here 2 days ago for same,  Return with same h/a

## 2012-04-13 NOTE — Telephone Encounter (Signed)
Mother called concerned regarding pts HA she has an appt with neurologist wanted test results advised I could give #s but still awaiting SR's rec as was relayed earlier she understands SRstill neds to to review DFaulconerRN

## 2012-04-13 NOTE — Telephone Encounter (Signed)
Spoke with pt regarding conversation with her Mother she knew that her mother was going to call pt has bad HA today awaiting appt with [facility] on 04/20/12 advised pt that SR has not reviewed labs so we will have her recs and call her DFaulconerRN

## 2012-04-13 NOTE — ED Provider Notes (Signed)
History     CSN: [id number]  Arrival date & time 04/13/12  1620   First MD Initiated Contact with Patient 04/13/12 1640      Chief Complaint  Patient presents with  . Migraine    (Consider location/radiation/quality/duration/timing/severity/associated sxs/prior treatment) HPI Pt with history of migraines who is yet to see a neurologist but has an appointment, presents with bi-temporal HA described as pounding of slow onset lasting several days. She states she has not taken any medication at home for the HA. +photophobia and nausea. The features of this HA are identical to previous migraines. No fever, neck pain or stiffness. No focal weakness or sensory changes.  Past Medical History  Diagnosis Date  . Tachycardia   . Migraine   . Back pain   . Neck pain   . Chronic headaches     History reviewed. No pertinent past surgical history.  Family History  Problem Relation Age of Onset  . Breast cancer Maternal Grandmother   . Diabetes Father   .       History  Substance Use Topics  . Smoking status: Never Smoker   . Smokeless tobacco: Never Used  . Alcohol Use: No    OB History    Grav Para Term Preterm Abortions TAB SAB Ect Mult Living   1 1 1       1       Review of Systems  Constitutional: Negative for fever and chills.  HENT: Negative for congestion, sore throat, rhinorrhea, neck pain, neck stiffness and sinus pressure.   Eyes: Positive for photophobia. Negative for visual disturbance.  Respiratory: Negative for shortness of breath and wheezing.   Cardiovascular: Negative for chest pain.  Gastrointestinal: Positive for nausea. Negative for vomiting, abdominal pain, diarrhea and constipation.  Musculoskeletal: Negative for back pain.  Skin: Negative for rash.  Neurological: Positive for headaches. Negative for dizziness, seizures, syncope, weakness, light-headedness and numbness.    Allergies  Penicillins  Home Medications   Current Outpatient Rx  Name  Route Sig Dispense Refill  . BENADRYL ALLERGY PO Oral Take 1 tablet by mouth daily as needed. Patient used this medication for sleep due to her headache.    . IBUPROFEN 200 MG PO TABS Oral Take 400 mg by mouth every 6 (six) hours as needed. Patient used this medication for her headache.    . ALEVE PO Oral Take 1 tablet by mouth daily as needed. Patient used this medication for her headache.    Marland Kitchen HYDROCODONE-ACETAMINOPHEN 5-325 MG PO TABS Oral Take 1 tablet by mouth every 4 (four) hours as needed for pain. 15 tablet 0  . VICODIN PO Oral Take 1 tablet by mouth daily as needed. Patient used this medication with the advil today for her headache.    . IBUPROFEN 600 MG PO TABS Oral Take 1 tablet (600 mg total) by mouth every 6 (six) hours as needed for pain. 30 tablet 0  . METOCLOPRAMIDE HCL 10 MG PO TABS Oral Take 1 tablet (10 mg total) by mouth every 6 (six) hours. 30 tablet 0    BP 125/88  Pulse 95  Temp 98.2 F (36.8 C) (Oral)  Resp 20  Ht 5\' 8"  (1.727 m)  Wt 142 lb (64.411 kg)  BMI 21.59 kg/m2  SpO2 100%  LMP 04/09/2012  Physical Exam  Nursing note and vitals reviewed. Constitutional: She is oriented to person, place, and time. She appears well-developed and well-nourished. No distress.       Pt  is dark room. Talking quitely  HENT:  Head: Normocephalic and atraumatic.  Mouth/Throat: Oropharynx is clear and moist.  Eyes: EOM are normal. Pupils are equal, round, and reactive to light.  Neck: Normal range of motion. Neck supple.       No meningismus   Cardiovascular: Regular rhythm.        tachycardia  Pulmonary/Chest: Effort normal and breath sounds normal. No respiratory distress. She has no wheezes. She has no rales.  Abdominal: Soft. Bowel sounds are normal. There is no tenderness. There is no rebound and no guarding.  Musculoskeletal: Normal range of motion. She exhibits no edema and no tenderness.  Neurological: She is alert and oriented to person, place, and time.       5/5  motor, sensation intact  Skin: Skin is warm and dry. No rash noted. No erythema.  Psychiatric: She has a normal mood and affect. Her behavior is normal.    ED Course  Procedures (including critical care time)   Labs Reviewed  PREGNANCY, URINE   No results found.   1. Migraine       MDM   Pt HR elevated on presentation and has been all prior presentations to ED. She has a past medical history of tachycardia and states she has been told to avoid caffeine. She states that she has not been drinking much fluid lately which is likely contributing to her ongoing HA. She has received 1 L NS, toradol, morphine, and reglan in ED and states she feels much better. Repeat HR is 95. Will d/c home to follow up in [facility] clinic next week. Return for concerns       Loren Racer, MD 04/13/12 [phone]

## 2012-04-14 ENCOUNTER — Emergency Department (HOSPITAL_COMMUNITY): Payer: BC Managed Care – PPO

## 2012-04-14 ENCOUNTER — Emergency Department (HOSPITAL_COMMUNITY)
Admission: EM | Admit: 2012-04-14 | Discharge: 2012-04-14 | Disposition: A | Discharge status: Home / Self Care | Payer: BC Managed Care – PPO | Attending: Emergency Medicine | Admitting: Emergency Medicine

## 2012-04-14 ENCOUNTER — Encounter (HOSPITAL_COMMUNITY): Payer: Self-pay

## 2012-04-14 DIAGNOSIS — Z79899 Other long term (current) drug therapy: Secondary | ICD-10-CM | POA: Insufficient documentation

## 2012-04-14 DIAGNOSIS — R51 Headache: Secondary | ICD-10-CM | POA: Insufficient documentation

## 2012-04-14 MED ORDER — METHOCARBAMOL 500 MG PO TABS: 500.0000 mg | ORAL_TABLET | Freq: Two times a day (BID) | ORAL | Status: AC

## 2012-04-14 MED ORDER — AZITHROMYCIN 250 MG PO TABS
ORAL_TABLET | ORAL | Status: AC
  Filled 2012-04-14: qty 1

## 2012-04-14 MED ORDER — LORAZEPAM 2 MG/ML IJ SOLN
1.0000 mg | Freq: Once | INTRAMUSCULAR | Status: AC
  Administered 2012-04-14: 1 mg via INTRAVENOUS
  Filled 2012-04-14: qty 1

## 2012-04-14 MED ORDER — HALOPERIDOL LACTATE 5 MG/ML IJ SOLN
2.5000 mg | Freq: Once | INTRAMUSCULAR | Status: AC
  Administered 2012-04-14: 2.5 mg via INTRAVENOUS
  Filled 2012-04-14: qty 1

## 2012-04-14 MED ORDER — METOCLOPRAMIDE HCL 5 MG/ML IJ SOLN
10.0000 mg | Freq: Once | INTRAMUSCULAR | Status: AC
  Administered 2012-04-14: 10 mg via INTRAVENOUS
  Filled 2012-04-14 (×2): qty 2

## 2012-04-14 MED ORDER — DEXAMETHASONE SODIUM PHOSPHATE 10 MG/ML IJ SOLN
10.0000 mg | Freq: Once | INTRAMUSCULAR | Status: AC
  Administered 2012-04-14: 10 mg via INTRAVENOUS
  Filled 2012-04-14: qty 1

## 2012-04-14 MED ORDER — DIPHENHYDRAMINE HCL 50 MG/ML IJ SOLN
25.0000 mg | Freq: Once | INTRAMUSCULAR | Status: AC
  Administered 2012-04-14: 25 mg via INTRAVENOUS
  Filled 2012-04-14 (×3): qty 1

## 2012-04-14 MED ORDER — KETOROLAC TROMETHAMINE 30 MG/ML IJ SOLN
30.0000 mg | Freq: Once | INTRAMUSCULAR | Status: AC
  Administered 2012-04-14: 30 mg via INTRAVENOUS
  Filled 2012-04-14 (×2): qty 1

## 2012-04-14 MED ORDER — VALPROATE SODIUM 500 MG/5ML IV SOLN
500.0000 mg | Freq: Once | INTRAVENOUS | Status: DC
  Filled 2012-04-14 (×2): qty 5

## 2012-04-14 MED ORDER — DEXTROSE 5 % IV SOLN
500.0000 mg | Freq: Once | INTRAVENOUS | Status: AC
  Administered 2012-04-14: 500 mg via INTRAVENOUS
  Filled 2012-04-14: qty 5

## 2012-04-14 MED ORDER — SODIUM CHLORIDE 0.9 % IV BOLUS (SEPSIS)
1000.0000 mL | Freq: Once | INTRAVENOUS | Status: AC
  Administered 2012-04-14 (×2): 1000 mL via INTRAVENOUS

## 2012-04-14 NOTE — ED Provider Notes (Signed)
7:26 PM Patient reports no improvement of pain. Offered IV mag or DHE but patient refuses. States she would rather be discharged. [facility]. Will d/c patient and advised tylenol and ibuprofen at home. Pt voices understanding and is ready for d/c   Ct Head Wo Contrast  04/14/2012  *RADIOLOGY REPORT*  Clinical Data: Migraine headache.  CT HEAD WITHOUT CONTRAST  Technique:  Contiguous axial images were obtained from the base of the skull through the vertex without contrast.  Comparison: No priors.  Findings: No acute displaced skull fractures.  Visualized paranasal sinuses and mastoids are well pneumatized.  No acute intracranial abnormalities.  Specifically, no definite evidence of acute/subacute cerebral ischemia, no acute intracranial hemorrhage, no focal mass, mass effect, hydrocephalus or abnormal intra or extra-axial fluid collections.  IMPRESSION: 1.  No acute intracranial abnormalities. 2.  The appearance of the brain is normal.  Original Report Authenticated By: Florencia Reasons, M.D.      Thomasene Lot, PA-C 04/14/12 1945

## 2012-04-14 NOTE — ED Notes (Signed)
Pt here with headche, here for same this past week,

## 2012-04-14 NOTE — ED Notes (Signed)
Pt moved from d 30 to the cdu for pain control and finishing her iv of depakote. On arrival to the cdu  She walked to the br with minimal assistance.  Her mother is very concerned and states she wants her daughters headache to go away.  No other med can be given until until  Her depakote  Is infused

## 2012-04-14 NOTE — ED Notes (Signed)
The  Pt is sleeping at present.  Her mother is unsure if she is still hurting and  Does not wish  To wake her

## 2012-04-14 NOTE — ED Provider Notes (Signed)
History     CSN: [id number]  Arrival date & time 04/14/12  1105   First MD Initiated Contact with Patient 04/14/12 1146      Chief Complaint  Patient presents with  . Migraine    (Consider location/radiation/quality/duration/timing/severity/associated sxs/prior treatment) HPI Comments: 3rd ED visit in the past week for "migraine" headache.  The headache started 5 days ago gradually and has waxed and waned.  Denies thunderclap onset.  Associated with nausea and photophobia.  No fever, vomiting, focal weakness or sensory deficit. Has an appointment with neurologist that she has not yet seen.  States gets some relief in ED but headache returns at home.  The history is provided by the patient.    Past Medical History  Diagnosis Date  . Tachycardia   . Migraine   . Back pain   . Neck pain   . Chronic headaches     No past surgical history on file.  Family History  Problem Relation Age of Onset  . Breast cancer Maternal Grandmother   . Diabetes Father   .       History  Substance Use Topics  . Smoking status: Never Smoker   . Smokeless tobacco: Never Used  . Alcohol Use: No    OB History    Grav Para Term Preterm Abortions TAB SAB Ect Mult Living   1 1 1       1       Review of Systems  Constitutional: Negative for fever and activity change.  HENT: Negative for congestion and rhinorrhea.   Eyes: Positive for photophobia. Negative for visual disturbance.  Respiratory: Negative for cough, chest tightness and shortness of breath.   Cardiovascular: Negative for chest pain.  Gastrointestinal: Positive for nausea. Negative for vomiting and abdominal pain.  Genitourinary: Negative for dysuria and hematuria.  Musculoskeletal: Negative for back pain.  Skin: Negative for rash.  Neurological: Positive for headaches. Negative for dizziness, weakness and light-headedness.    Allergies  Penicillins  Home Medications   Current Outpatient Rx  Name Route Sig Dispense  Refill  . VITAMIN D 2000 UNITS PO TABS Oral Take 2,000 Units by mouth daily.    Marland Kitchen BENADRYL ALLERGY PO Oral Take 1 tablet by mouth daily as needed. Patient used this medication for sleep due to her headache.    Marland Kitchen HYDROCODONE-ACETAMINOPHEN 5-325 MG PO TABS Oral Take 1 tablet by mouth every 4 (four) hours as needed for pain. 15 tablet 0  . IBUPROFEN 200 MG PO TABS Oral Take 400 mg by mouth every 6 (six) hours as needed. Patient used this medication for her headache.    Marland Kitchen METOCLOPRAMIDE HCL 10 MG PO TABS Oral Take 1 tablet (10 mg total) by mouth every 6 (six) hours. 30 tablet 0  . ALEVE PO Oral Take 1 tablet by mouth daily as needed. Patient used this medication for her headache.    Azzie Roup ACE-ETH ESTRAD-FE 1-20 MG-MCG PO TABS Oral Take 1 tablet by mouth daily.      BP 137/91  Pulse 133  Temp 98.1 F (36.7 C) (Oral)  Resp 18  SpO2 98%  LMP 04/09/2012  Physical Exam  Constitutional: She is oriented to person, place, and time. She appears well-developed and well-nourished.  HENT:  Head: Normocephalic and atraumatic.  Mouth/Throat: Oropharynx is clear and moist. No oropharyngeal exudate.       No appreciable papilledema  Eyes: Conjunctivae and EOM are normal. Pupils are equal, round, and reactive to light.  Neck:  Normal range of motion. Neck supple.       No meningismus  Cardiovascular: Normal rate, regular rhythm and normal heart sounds.        tachycardia  Musculoskeletal: Normal range of motion. She exhibits no edema and no tenderness.  Neurological: She is alert and oriented to person, place, and time. No cranial nerve deficit.  Skin: Skin is warm.    ED Course  Procedures (including critical care time)  Labs Reviewed - No data to display Ct Head Wo Contrast  04/14/2012  *RADIOLOGY REPORT*  Clinical Data: Migraine headache.  CT HEAD WITHOUT CONTRAST  Technique:  Contiguous axial images were obtained from the base of the skull through the vertex without contrast.  Comparison:  No priors.  Findings: No acute displaced skull fractures.  Visualized paranasal sinuses and mastoids are well pneumatized.  No acute intracranial abnormalities.  Specifically, no definite evidence of acute/subacute cerebral ischemia, no acute intracranial hemorrhage, no focal mass, mass effect, hydrocephalus or abnormal intra or extra-axial fluid collections.  IMPRESSION: 1.  No acute intracranial abnormalities. 2.  The appearance of the brain is normal.  Original Report Authenticated By: Florencia Reasons, M.D.     1. Headache       MDM  3rd visit this week for "migraine" headache. No neuro deficits, no meningismus. Tachycardia similar to previous.   Patient has received NS, toradol, reglan, benadryl, decadron, depacon, ativan, haldol. Patient informed she would not be receiving narcotics for her headache. CT obtained given repeat visits.  Care continued in CDU.    Date: 04/14/2012  Rate: 122  Rhythm: sinus tachycardia  QRS Axis: normal  Intervals: normal  ST/T Wave abnormalities: normal  Conduction Disutrbances:none  Narrative Interpretation:   Old EKG Reviewed: unchanged    Glynn Octave, MD 04/14/12 1615

## 2012-04-14 NOTE — Discharge Instructions (Signed)
Headache, General, Unknown Cause Follow up with your neurologist as scheduled.  Return to the ED if you develop new or worsening symptoms. The specific cause of your headache may not have been found today. There are many causes and types of headache. A few common ones are:  Tension headache.   Migraine.   Infections (examples: dental and sinus infections).   Bone and/or joint problems in the neck or jaw.   Depression.   Eye problems.  These headaches are not life threatening.  Headaches can sometimes be diagnosed by a patient history and a physical exam. Sometimes, lab and imaging studies (such as x-ray and/or CT scan) are used to rule out more serious problems. In some cases, a spinal tap (lumbar puncture) may be requested. There are many times when your exam and tests may be normal on the first visit even when there is a serious problem causing your headaches. Because of that, it is very important to follow up with your doctor or local clinic for further evaluation. FINDING OUT THE RESULTS OF TESTS  If a radiology test was performed, a radiologist will review your results.   You will be contacted by the emergency department or your physician if any test results require a change in your treatment plan.   Not all test results may be available during your visit. If your test results are not back during the visit, make an appointment with your caregiver to find out the results. Do not assume everything is normal if you have not heard from your caregiver or the medical facility. It is important for you to follow up on all of your test results.  HOME CARE INSTRUCTIONS   Keep follow-up appointments with your caregiver, or any specialist referral.   Only take over-the-counter or prescription medicines for pain, discomfort, or fever as directed by your caregiver.   Biofeedback, massage, or other relaxation techniques may be helpful.   Ice packs or heat applied to the head and neck can be  used. Do this three to four times per day, or as needed.   Call your doctor if you have any questions or concerns.   If you smoke, you should quit.  SEEK MEDICAL CARE IF:   You develop problems with medications prescribed.   You do not respond to or obtain relief from medications.   You have a change from the usual headache.   You develop nausea or vomiting.  SEEK IMMEDIATE MEDICAL CARE IF:   If your headache becomes severe.   You have an unexplained oral temperature above 102 F (38.9 C), or as your caregiver suggests.   You have a stiff neck.   You have loss of vision.   You have muscular weakness.   You have loss of muscular control.   You develop severe symptoms different from your first symptoms.   You start losing your balance or have trouble walking.   You feel faint or pass out.  MAKE SURE YOU:   Understand these instructions.   Will watch your condition.   Will get help right away if you are not doing well or get worse.  Document Released: 10/05/2005 Document Revised: 09/24/2011 Document Reviewed: 05/24/2008 [facility] Patient Information 2012 [place], [place].

## 2012-04-14 NOTE — ED Notes (Signed)
The pt states her headache is no better.  The mother is c/o that the pt has been here for 8 hours and still has pain.  Dr Manus Gunning saw this pt before he left and he was not going to give a narcotic pain med which is what the pt wants

## 2012-04-15 NOTE — ED Provider Notes (Signed)
Medical screening examination/treatment/procedure(s) were performed by non-physician practitioner and as supervising physician I was immediately available for consultation/collaboration.  Cheri Guppy, MD 04/15/12 [phone]

## 2012-04-19 ENCOUNTER — Telehealth: Payer: Self-pay

## 2012-04-19 NOTE — Telephone Encounter (Signed)
FYI - Pt going to [facility] for evaluation today.  TSH and PRL normal from 03-31-12.  Pt states she is keeping a log of symptoms during cycle and will call with concerns.  ld

## 2012-08-04 ENCOUNTER — Ambulatory Visit: Payer: BC Managed Care – PPO | Admitting: Obstetrics and Gynecology

## 2012-08-19 ENCOUNTER — Encounter: Payer: Self-pay | Admitting: Obstetrics and Gynecology

## 2012-08-19 ENCOUNTER — Ambulatory Visit (INDEPENDENT_AMBULATORY_CARE_PROVIDER_SITE_OTHER): Payer: BC Managed Care – PPO | Admitting: Obstetrics and Gynecology

## 2012-08-19 VITALS — BP 110/70 | Resp 14 | Wt 136.0 lb

## 2012-08-19 DIAGNOSIS — N943 Premenstrual tension syndrome: Secondary | ICD-10-CM

## 2012-08-19 NOTE — Progress Notes (Signed)
Subjective:    Stephanie Wagner is a 31 y.o. female, G1P1001, who presents for 3 month f/u appt following 3 month trial of Loestrin for PMS symptoms.  Pt states she is no longer taking Loestrin and symptoms have gone away.  Has made drastic dietary and lifestyle changes with fantastic results.    Objective:    BP 110/70  Resp 14  Wt 136 lb (61.689 kg)  LMP 08/15/2012    Weight:  Wt Readings from Last 1 Encounters:  08/19/12 136 lb (61.689 kg)          BMI: There is no height on file to calculate BMI.  General Appearance: Alert, appropriate appearance for age. No acute distress   Assessment:    Resolved PMS    Plan:    return annually or prn     Butler [place] PMD

## 2013-02-23 ENCOUNTER — Emergency Department (HOSPITAL_BASED_OUTPATIENT_CLINIC_OR_DEPARTMENT_OTHER)
Admission: EM | Admit: 2013-02-23 | Discharge: 2013-02-23 | Disposition: A | Discharge status: Home / Self Care | Payer: 59 | Attending: Emergency Medicine | Admitting: Emergency Medicine

## 2013-02-23 ENCOUNTER — Emergency Department (HOSPITAL_BASED_OUTPATIENT_CLINIC_OR_DEPARTMENT_OTHER): Payer: 59

## 2013-02-23 ENCOUNTER — Encounter (HOSPITAL_BASED_OUTPATIENT_CLINIC_OR_DEPARTMENT_OTHER): Payer: Self-pay | Admitting: *Deleted

## 2013-02-23 DIAGNOSIS — S92919A Unspecified fracture of unspecified toe(s), initial encounter for closed fracture: Secondary | ICD-10-CM | POA: Insufficient documentation

## 2013-02-23 DIAGNOSIS — Z79899 Other long term (current) drug therapy: Secondary | ICD-10-CM | POA: Insufficient documentation

## 2013-02-23 DIAGNOSIS — S92911A Unspecified fracture of right toe(s), initial encounter for closed fracture: Secondary | ICD-10-CM

## 2013-02-23 DIAGNOSIS — W2203XA Walked into furniture, initial encounter: Secondary | ICD-10-CM | POA: Insufficient documentation

## 2013-02-23 DIAGNOSIS — Y9301 Activity, walking, marching and hiking: Secondary | ICD-10-CM | POA: Insufficient documentation

## 2013-02-23 DIAGNOSIS — Z8679 Personal history of other diseases of the circulatory system: Secondary | ICD-10-CM | POA: Insufficient documentation

## 2013-02-23 DIAGNOSIS — Y929 Unspecified place or not applicable: Secondary | ICD-10-CM | POA: Insufficient documentation

## 2013-02-23 DIAGNOSIS — Z880 Allergy status to penicillin: Secondary | ICD-10-CM | POA: Insufficient documentation

## 2013-02-23 MED ORDER — OXYCODONE-ACETAMINOPHEN 5-325 MG PO TABS: 2.0000 | ORAL_TABLET | ORAL | Status: DC | PRN

## 2013-02-23 NOTE — ED Notes (Signed)
Pt hit right fifth toe on couch

## 2013-02-23 NOTE — ED Provider Notes (Signed)
History     CSN: [id number]  Arrival date & time 02/23/13  1751   First MD Initiated Contact with Patient 02/23/13 1802      Chief Complaint  Patient presents with  . Foot Injury    (Consider location/radiation/quality/duration/timing/severity/associated sxs/prior treatment) HPI Comments: Patient presents with an injury to her right fifth toe. She states that she was walking by couch and hit it on the wooden leg of the couch. She had intense pain at initiation that since she's off now. She says this happened about an hour prior to arrival emergency department. She has constant throbbing pain to the toe. She denies any other injuries. She's not taking anything for the pain but currently denies the need for pain medicine.    Patient is a 32 y.o. female presenting with foot injury.  Foot Injury Associated symptoms: no back pain and no fever     Past Medical History  Diagnosis Date  . Tachycardia   . Migraine   . Back pain   . Neck pain   . Chronic headaches     Past Surgical History  Procedure Laterality Date  . Wisdom tooth extraction      Family History  Problem Relation Age of Onset  . Breast cancer Maternal Grandmother   . Diabetes Father   . Kidney disease Father     transplant  .     Marland Kitchen Heart disease Maternal Grandfather   . Cancer Maternal Grandfather     testicular  . Thyroid disease Mother     History  Substance Use Topics  . Smoking status: Never Smoker   . Smokeless tobacco: Never Used  . Alcohol Use: No    OB History   Grav Para Term Preterm Abortions TAB SAB Ect Mult Living   1 1 1       1       Review of Systems  Constitutional: Negative for fever.  Eyes: Negative.   Gastrointestinal: Negative for nausea and vomiting.  Musculoskeletal: Positive for joint swelling and arthralgias. Negative for back pain.  Skin: Negative for rash and wound.  Neurological: Negative for weakness and numbness.    Allergies  Penicillins  Home Medications    Current Outpatient Rx  Name  Route  Sig  Dispense  Refill  . Prenatal Vit-Fe Fumarate-FA (PRENATAL MULTIVITAMIN) TABS   Oral   Take 1 tablet by mouth daily at 12 noon.         . Cholecalciferol (VITAMIN D) 2000 UNITS tablet   Oral   Take 2,000 Units by mouth daily.         . DiphenhydrAMINE HCl (BENADRYL ALLERGY PO)   Oral   Take 1 tablet by mouth daily as needed. Patient used this medication for sleep due to her headache.         . ibuprofen (ADVIL,MOTRIN) 200 MG tablet   Oral   Take 400 mg by mouth every 6 (six) hours as needed. Patient used this medication for her headache.         Marland Kitchen EXPIRED: metoCLOPramide (REGLAN) 10 MG tablet   Oral   Take 1 tablet (10 mg total) by mouth every 6 (six) hours.   30 tablet   0   . Naproxen Sodium (ALEVE PO)   Oral   Take 1 tablet by mouth daily as needed. Patient used this medication for her headache.         . norethindrone-ethinyl estradiol (JUNEL FE,GILDESS FE,LOESTRIN FE) 1-20 MG-MCG tablet  Oral   Take 1 tablet by mouth daily.         Marland Kitchen oxyCODONE-acetaminophen (PERCOCET) 5-325 MG per tablet   Oral   Take 2 tablets by mouth every 4 (four) hours as needed for pain.   20 tablet   0     BP 117/79  Pulse 114  Temp(Src) 98.5 F (36.9 C) (Oral)  Resp 18  Ht 5\' 8"  (1.727 m)  Wt 128 lb (58.06 kg)  BMI 19.47 kg/m2  SpO2 96%  LMP 02/09/2013  Physical Exam  Constitutional: She is oriented to person, place, and time. She appears well-developed and well-nourished.  HENT:  Head: Normocephalic and atraumatic.  Neck: Normal range of motion. Neck supple.  Cardiovascular: Normal rate.   Pulmonary/Chest: Effort normal.  Musculoskeletal: She exhibits edema and tenderness.  There is positive swelling and ecchymosis along with tenderness to the fifth digit of the right foot. Is mostly over the proximal phalanx. There some mild pain over the metatarsal. There is no other bony tenderness to the foot. Chest normal sensation  distally. Motor function is limited due to pain. Capillary refill is less than 2 distally.  Neurological: She is alert and oriented to person, place, and time.  Skin: Skin is warm and dry.  Psychiatric: She has a normal mood and affect.    ED Course  Procedures (including critical care time)   Dg Foot Complete Right  02/23/2013  *RADIOLOGY REPORT*  Clinical Data:   pain post trauma  RIGHT FOOT COMPLETE - 3+ VIEW  Comparison: None.  Findings: Frontal, oblique, and lateral views were obtained.  There is a nondisplaced fracture of the proximal portion of the fifth proximal phalanx.  No other fractures.  No dislocation.  Joint spaces appear intact.  IMPRESSION: Fracture proximal portion fifth proximal phalanx.   Original Report Authenticated By: Bretta Bang, M.D.      1. Phalanx fracture, foot, right, closed, initial encounter       MDM  Patient on a nondisplaced fracture of the proximal phalanx of the right fifth digit. Her toes were buddy taped and she was given a postop shoe to wear. She was given a prescription for some Percocet to use as needed for pain in addition ibuprofen. She was advised in ice and elevation. Was advised to follow with her primary care physician for recheck and can be referred to Ortho as needed.        Rolan Bucco, MD 02/23/13 [phone]

## 2013-08-29 ENCOUNTER — Encounter: Payer: Self-pay | Admitting: Podiatry

## 2013-08-29 ENCOUNTER — Ambulatory Visit (INDEPENDENT_AMBULATORY_CARE_PROVIDER_SITE_OTHER): Payer: Managed Care, Other (non HMO) | Admitting: Podiatry

## 2013-08-29 ENCOUNTER — Ambulatory Visit (INDEPENDENT_AMBULATORY_CARE_PROVIDER_SITE_OTHER): Payer: Managed Care, Other (non HMO)

## 2013-08-29 VITALS — BP 110/72 | HR 97 | Resp 16

## 2013-08-29 DIAGNOSIS — R52 Pain, unspecified: Secondary | ICD-10-CM

## 2013-08-29 DIAGNOSIS — G5760 Lesion of plantar nerve, unspecified lower limb: Secondary | ICD-10-CM

## 2013-08-29 DIAGNOSIS — Q828 Other specified congenital malformations of skin: Secondary | ICD-10-CM

## 2013-08-29 DIAGNOSIS — G5761 Lesion of plantar nerve, right lower limb: Secondary | ICD-10-CM

## 2013-08-29 MED ORDER — TRIAMCINOLONE ACETONIDE 40 MG/ML IJ SUSP
20.0000 mg | Freq: Once | INTRAMUSCULAR | Status: AC
  Administered 2013-08-29: 20 mg

## 2013-08-29 NOTE — Progress Notes (Signed)
Stephanie Wagner presents today as a 32 year old white female with a chief complaint of pain to the fourth digit of the right foot this been present for proximally 7 years. She denies any trauma to the toe which is been to see today for podiatrist to no avail. She also has a painful lesion to the plantar aspect of the second metatarsophalangeal joint area of the left foot.  Objective: Vital signs are stable she is alert and oriented x3. I have reviewed her past medical history medications and allergies. Bilateral lower extremity vascular evaluation demonstrates strong palpable pulses bilateral capillary fill time to digits one through 5 is immediate. Neurologic sensorium is intact bilateral deep tendon reflexes are intact bilateral muscle strength is 5 over 5 dorsiflexors plantar flexors inverters and inverters all intrinsic musculature is intact. Multiple hammertoe deformities are noted on orthopedic evaluation particularly the fourth digit of the right foot exquisitely tender on palpation as well as on palpation to the third interdigital space of the right foot. She has mild hammertoe deformity flexible in nature at the second metatarsophalangeal joint of the left foot as well she has tenderness on palpation and in range of motion of the second metatarsophalangeal joint left. Radiographic evaluation does demonstrate elongated second metatarsals resulting in her capsulitis to the second metatarsal left foot.  Assessment: Capsulitis of the second metatarsophalangeal joint left with reactive hyperkeratosis plantarly secondary to long plantarflexed second metatarsals. 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  Plan: We discussed the etiology pathology conservative versus surgical therapies at this point I debrided the reactive hyperkeratosis to the plantar aspect of the left foot. I also injected 20 mg of Kenalog to the third interdigital space of the right foot will followup with her in one month. We discussed  appropriate shoe stretching her size and ice therapy.

## 2013-08-29 NOTE — Patient Instructions (Signed)
Morton's Neuroma Neuralgia (nerve pain) or neuroma (benign [non-cancerous] nerve tumor) may develop on any interdigital nerve. The interdigital nerves (nerves between digits) of the foot travel beneath and between the metatarsals (long bones of the fore foot) and pass the nerve endings to the toes. The third interdigital is a common place for a small neuroma to form called Morton's neuroma. Another nerve to be affected commonly is the fourth interdigital nerve. This would be in approximately in the area of the base or ball under the bottom of your fourth toe. This condition occurs more commonly in women and is usually on one side. It is usually first noticed by pain radiating (spreading) to the ball of the foot or to the toes. CAUSES The cause of interdigital neuralgia may be from low grade repetitive trauma (damage caused by an accident) as in activities causing a repeated pounding of the foot (running, jumping etc.). It is also caused by improper footwear or recent loss of the fatty padding on the bottom of the foot. TREATMENT  The condition often resolves (goes away) simply with decreasing activity if that is thought to be the cause. Proper shoes are beneficial. Orthotics (special foot support aids) such as a metatarsal bar are often beneficial. This condition usually responds to conservative therapy, however if surgery is necessary it usually brings complete relief. HOME CARE INSTRUCTIONS   Apply ice to the area of soreness for 15-20 minutes, 03-04 times per day, while awake for the first 2 days. Put ice in a plastic bag and place a towel between the bag of ice and your skin.  Only take over-the-counter or prescription medicines for pain, discomfort, or fever as directed by your caregiver. MAKE SURE YOU:   Understand these instructions.  Will watch your condition.  Will get help right away if you are not doing well or get worse. Document Released: 01/11/2001 Document Revised: 12/28/2011  Document Reviewed: 10/05/2005 [employer] Patient Information 2014 [employer].  

## 2013-08-29 NOTE — Progress Notes (Signed)
N feels like the toe is breaking  L right #4 toe  D 7 years ago  O all of sudden  C worse  A walking on barefoot  T different shoes , orthotics , been to two different podiatrist   N PAINFUL SPOT    L BOTTOM OF #2 LEFT MET   D 8 YEARS   O GRADUAL   C WORSE   A WALK ON IT   T OVER COUNTER DR Jari Sportsman

## 2013-09-26 ENCOUNTER — Encounter: Payer: Self-pay | Admitting: Podiatry

## 2013-09-26 ENCOUNTER — Ambulatory Visit (INDEPENDENT_AMBULATORY_CARE_PROVIDER_SITE_OTHER): Payer: Managed Care, Other (non HMO) | Admitting: Podiatry

## 2013-09-26 VITALS — BP 105/72 | HR 91 | Resp 16

## 2013-09-26 DIAGNOSIS — G5781 Other specified mononeuropathies of right lower limb: Secondary | ICD-10-CM

## 2013-09-26 DIAGNOSIS — G5760 Lesion of plantar nerve, unspecified lower limb: Secondary | ICD-10-CM

## 2013-09-26 NOTE — Progress Notes (Signed)
   Subjective:    Patient ID: Stephanie Wagner, female    DOB: 1980-12-01, 32 y.o.   MRN: [id number]  HPI Comments: " it still feels the same, it was numb for a day or two but went back to feeling the same, but the left foot is good."     Review of Systems     Objective:   Physical Exam: I have reviewed her past medical history medications and allergies. Pulses remain palpable right lower extremity. Resolution of pain to the left foot. Right foot does demonstrate pain on palpation with a palpable click to the third interdigital space of the right foot.        Assessment & Plan:  Assessment: Neuroma third interdigital space right foot did not resolve with cortisone injection.  Plan: We initiated her first dose of dehydrated alcohol today to the point of maximal tenderness third interdigital space right foot. Followup with her in 3 weeks.

## 2013-10-16 ENCOUNTER — Telehealth: Payer: Self-pay | Admitting: *Deleted

## 2013-10-16 NOTE — Telephone Encounter (Addendum)
Pt states had to reschedule 10/17/2013 appt for neurolysis injection, to 10/31/2013.  Pt ask if the length of time between the 1st injection 09/26/2013 and the next 10/31/2013 would make a difference in her therapy.  I will check with Dr Al Corpus and call again.  10/17/2013, I informed pt of Dr Geryl Rankins recommendation.

## 2013-10-16 NOTE — Telephone Encounter (Signed)
Yes it may but tell her not to worry about it.

## 2013-10-17 ENCOUNTER — Ambulatory Visit: Payer: Managed Care, Other (non HMO) | Admitting: Podiatry

## 2013-10-19 NOTE — L&D Delivery Note (Signed)
Delivery Note At 1:07 PM a viable female, "Stephanie Wagner", was delivered via Vaginal, Spontaneous Delivery (Presentation: ROA ;  ).  APGAR: 8, 9; weight .   Placenta status: Intact, Spontaneous.  Cord: 3 vessels with the following complications: Loose CAN x 1, reduced over body at delivery, cord around arm.  Cord pH: NA  Anesthesia: Local, Fentanyl IV post-delivery for repair Episiotomy: None Lacerations: 2nd degree;Perineal Suture Repair: 3.0 vicryl Est. Blood Loss (mL): 300  Mom to postpartum.  Baby to Couplet care / Skin to Skin. Family plans vasectomy for contraception.  Grabiel Schmutz 08/02/2014, 1:49 PM

## 2013-10-31 ENCOUNTER — Ambulatory Visit: Payer: Managed Care, Other (non HMO) | Admitting: Podiatry

## 2013-11-02 ENCOUNTER — Ambulatory Visit: Payer: Managed Care, Other (non HMO) | Admitting: Podiatry

## 2013-11-02 ENCOUNTER — Encounter: Payer: Self-pay | Admitting: Podiatry

## 2013-11-02 ENCOUNTER — Ambulatory Visit (INDEPENDENT_AMBULATORY_CARE_PROVIDER_SITE_OTHER): Payer: Managed Care, Other (non HMO) | Admitting: Podiatry

## 2013-11-02 VITALS — BP 114/76 | HR 84 | Resp 12

## 2013-11-02 DIAGNOSIS — G5760 Lesion of plantar nerve, unspecified lower limb: Secondary | ICD-10-CM

## 2013-11-02 DIAGNOSIS — G588 Other specified mononeuropathies: Secondary | ICD-10-CM

## 2013-11-04 NOTE — Progress Notes (Signed)
She still having considerable pain to the third interdigital space of the right foot left foot is doing quite well.  Objective: Vital signs are stable she is alert and oriented x3. Pulses are palpable right. Positive Mulder's click to the third interdigital space of the right foot. Pain on palpation to the third interdigital space of the right foot.  Assessment: Neuroma third interdigital space right foot.  Plan: Injected her second dose of dehydrated alcohol to the point of maximal tenderness today we'll followup with her in 3 weeks.

## 2013-11-23 ENCOUNTER — Encounter: Payer: Self-pay | Admitting: Podiatry

## 2013-11-23 ENCOUNTER — Ambulatory Visit (INDEPENDENT_AMBULATORY_CARE_PROVIDER_SITE_OTHER): Payer: Managed Care, Other (non HMO) | Admitting: Podiatry

## 2013-11-23 VITALS — BP 121/84 | HR 90 | Resp 12

## 2013-11-23 DIAGNOSIS — G5760 Lesion of plantar nerve, unspecified lower limb: Secondary | ICD-10-CM

## 2013-11-23 DIAGNOSIS — G588 Other specified mononeuropathies: Secondary | ICD-10-CM

## 2013-11-23 NOTE — Progress Notes (Signed)
She presents today for followup of her neuroma. She states that the last injection really didn't do too much for her. She also states that she is [redacted] weeks pregnant at this point.  Objective pulses are palpable right. Pain on palpation third interdigital space right foot.  Assessment neuroma third interdigital space right.  Plan: No injections today and we will hold off on injections until she delivers.

## 2013-12-19 LAB — OB RESULTS CONSOLE ABO/RH: RH Type: POSITIVE

## 2013-12-19 LAB — OB RESULTS CONSOLE ANTIBODY SCREEN: ANTIBODY SCREEN: NEGATIVE

## 2013-12-19 LAB — OB RESULTS CONSOLE RUBELLA ANTIBODY, IGM: Rubella: IMMUNE

## 2013-12-19 LAB — OB RESULTS CONSOLE HEPATITIS B SURFACE ANTIGEN: Hepatitis B Surface Ag: NEGATIVE

## 2013-12-19 LAB — OB RESULTS CONSOLE RPR: RPR: NONREACTIVE

## 2013-12-19 LAB — OB RESULTS CONSOLE HIV ANTIBODY (ROUTINE TESTING): HIV: NONREACTIVE

## 2014-01-17 ENCOUNTER — Telehealth: Payer: Self-pay | Admitting: *Deleted

## 2014-01-17 NOTE — Telephone Encounter (Signed)
I called and left her a message that Dr. Al CorpusHyatt said Stephanie Wagner, Stephanie Wagner and Stephanie Wagner have good sandals.

## 2014-01-17 NOTE — Telephone Encounter (Signed)
[employer], [facility], [place]

## 2014-01-17 NOTE — Telephone Encounter (Signed)
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  But, I became pregnant so I had to stop getting them.  Does he know of a good sandal I can wear that's good for Neuromas?

## 2014-06-14 ENCOUNTER — Encounter (HOSPITAL_COMMUNITY): Payer: Self-pay | Admitting: *Deleted

## 2014-06-14 ENCOUNTER — Inpatient Hospital Stay (HOSPITAL_COMMUNITY)
Admission: AD | Admit: 2014-06-14 | Discharge: 2014-06-15 | Disposition: A | Discharge status: Home / Self Care | Payer: Managed Care, Other (non HMO) | Source: Ambulatory Visit | Attending: Obstetrics and Gynecology | Admitting: Obstetrics and Gynecology

## 2014-06-14 DIAGNOSIS — O4700 False labor before 37 completed weeks of gestation, unspecified trimester: Secondary | ICD-10-CM | POA: Insufficient documentation

## 2014-06-14 DIAGNOSIS — R197 Diarrhea, unspecified: Secondary | ICD-10-CM | POA: Insufficient documentation

## 2014-06-14 HISTORY — DX: Other allergy status, other than to drugs and biological substances: Z91.09

## 2014-06-14 HISTORY — DX: Other seasonal allergic rhinitis: J30.2

## 2014-06-14 LAB — URINALYSIS, ROUTINE W REFLEX MICROSCOPIC
BILIRUBIN URINE: NEGATIVE
GLUCOSE, UA: NEGATIVE mg/dL
HGB URINE DIPSTICK: NEGATIVE
KETONES UR: NEGATIVE mg/dL
Nitrite: NEGATIVE
PROTEIN: NEGATIVE mg/dL
Specific Gravity, Urine: <1.005 — ABNORMAL LOW (ref 1.005–1.030)
Urobilinogen, UA: 0.2 mg/dL (ref 0.0–1.0)
pH: 7 (ref 5.0–8.0)

## 2014-06-14 LAB — OB RESULTS CONSOLE GC/CHLAMYDIA
Chlamydia: NEGATIVE
Gonorrhea: NEGATIVE

## 2014-06-14 LAB — URINE MICROSCOPIC-ADD ON

## 2014-06-14 LAB — WET PREP, GENITAL
CLUE CELLS WET PREP: NONE SEEN
TRICH WET PREP: NONE SEEN
YEAST WET PREP: NONE SEEN

## 2014-06-14 MED ORDER — NIFEDIPINE 10 MG PO CAPS
10.0000 mg | ORAL_CAPSULE | ORAL | Status: AC | PRN
  Administered 2014-06-14 – 2014-06-15 (×4): 10 mg via ORAL
  Filled 2014-06-14 (×4): qty 1

## 2014-06-14 NOTE — Progress Notes (Signed)
Pt states that she had high BP in the end of her last pregnancy and was induced a week before her due date.

## 2014-06-14 NOTE — MAU Note (Signed)
PT SAYS SHE HAS CRAMPING - STARTED  AT 6PM-  PELVIC PRESSURE   STARTED  545PM.       FEELS SAME NOW  AS THEN.    IN OFFICE - NO VE.LAST SEEN  2 WEEKS AGO.   NEXT APPOINTMENT- 9-1.   LAST SEX-   July.   DENIES HSV AND MRSA.

## 2014-06-14 NOTE — MAU Provider Note (Signed)
History   Patient is a 33y.o. G2P1001 at 32.3wks who presents for contractions, back pain, and diarrhea.  Patient reports cramping and back pain started this evening around 6pm and have been intermittent, but has had diarrhea for the last 3 days. Patient denies change in eating habits.  Patient denies LOF, VB, and reports active fetus.  Patient reports adequate hydration and denies sexual intercourse in the past 48 hours.  Patient with negative history of PTL and/or delivery.  Patient denies N/V, and issues with urination, and reports some mild pelvic pressure.    There are no active problems to display for this patient.   No chief complaint on file.  HPI  OB History   Grav Para Term Preterm Abortions TAB SAB Ect Mult Living   Past Medical History  Diagnosis Date  . Tachycardia   . Migraine   . Back pain   . Neck pain   . Chronic headaches   . Seasonal allergies   . Environmental allergies     animals, dander    Past Surgical History  Procedure Laterality Date  . Wisdom tooth extraction      Family History  Problem Relation Age of Onset  . Breast cancer Maternal Grandmother   . Diabetes Father   . Kidney disease Father     transplant  .     Marland Kitchen Heart disease Maternal Grandfather   . Cancer Maternal Grandfather     testicular  . Thyroid disease Mother     History  Substance Use Topics  . Smoking status: Never Smoker   . Smokeless tobacco: Never Used  . Alcohol Use: No    Allergies:  Allergies  Allergen Reactions  . Penicillins Nausea And Vomiting    Prescriptions prior to admission  Medication Sig Dispense Refill  . calcium carbonate (TUMS - DOSED IN MG ELEMENTAL CALCIUM) 500 MG chewable tablet Chew 4-5 tablets by mouth daily as needed for heartburn.      . ferrous sulfate 325 (65 FE) MG tablet Take 325 mg by mouth daily with breakfast.      . Prenatal Vit-Fe Fumarate-FA (PRENATAL MULTIVITAMIN) TABS tablet Take 1 tablet by mouth daily at  12 noon.        ROS  See HPI Above Physical Exam   Blood pressure 117/82, pulse 108, temperature 98.5 F (36.9 C), temperature source Oral, resp. rate 18, height  (1.676 m), weight 168 lb (76.204 kg), last menstrual period 08/15/2013.  Results for orders placed during the hospital encounter of 06/14/14 (from the past 24 hour(s))  URINALYSIS, ROUTINE W REFLEX MICROSCOPIC     Status: Abnormal   Collection Time    06/14/14  8:16 PM      Result Value Ref Range   Color, Urine YELLOW  YELLOW   APPearance CLEAR  CLEAR   Specific Gravity, Urine <1.005 (*) 1.005 - 1.030   pH 7.0  5.0 - 8.0   Glucose, UA NEGATIVE  NEGATIVE mg/dL   Hgb urine dipstick NEGATIVE  NEGATIVE   Bilirubin Urine NEGATIVE  NEGATIVE   Ketones, ur NEGATIVE  NEGATIVE mg/dL   Protein, ur NEGATIVE  NEGATIVE mg/dL   Urobilinogen, UA 0.2  0.0 - 1.0 mg/dL   Nitrite NEGATIVE  NEGATIVE   Leukocytes, UA LARGE (*) NEGATIVE  URINE MICROSCOPIC-ADD ON     Status: Abnormal   Collection Time    06/14/14  8:16  PM      Result Value Ref Range   Squamous Epithelial / LPF MANY (*) RARE   WBC, UA 11-20  <3 WBC/hpf   RBC / HPF 0-2  <3 RBC/hpf   Bacteria, UA RARE  RARE   Urine-Other AMORPHOUS URATES/PHOSPHATES     Physical Exam  Constitutional: She is oriented to person, place, and time. She appears well-developed and well-nourished.  Cardiovascular: Normal rate, regular rhythm and normal heart sounds.   Respiratory: Effort normal and breath sounds normal.  GI: Soft. Bowel sounds are normal.  Appears gravid--fundal height appropriate for GA. Fundus soft, NT. Contractions palpate mild.    Genitourinary: Cervix exhibits friability. Cervix exhibits no motion tenderness and no discharge. There is bleeding around the vagina. Vaginal discharge found.  Sterile Speculum Exam: -Vaginal Vault: Thin white discharge noted-wet prep collected -Cervix: Thick yellowish discharge from os-GC/CT collected.  Friable.  Unable to collect fFN d/t  frank blood.  Bimanual Exam: Closed50/soft/-3  Musculoskeletal: Normal range of motion.  Neurological: She is alert and oriented to person, place, and time.  Skin: Skin is warm and dry.    ED Course  Assessment: IUP at 32.3wks Cat I FT Contractions  Plan: -Labs: UA, Wet Prep, Gc/CT -fFN not collected due to bleeding.  Patient to report to office and perform. -Patient declines pain medication for contraction discomfort -Procardia per [facility] protocol for contractions  Follow Up (0000) -Wet Prep-Negative -Patient continues to contract Q 1-70min. Nurse instructed to give procardia per [facility] protocol. -Dr. Kathie Rhodes. Rivard updated on patient status and agrees with plan.  Instructed to inform patient of need to return to MAU in 48hrs for fFN.  Follow Up (0110) -:Patient reports cramping has eased up  -Irritability noted on toco -Discussed need for fFN in 48 hours, to report to MAU on Saturday 8/29 -Bleeding and PTL precautions discussed -Keep appt as scheduled: 06/19/2014 -Call if you have any questions or concerns prior to your next visit.  -Discharged to home in stable condition    Marasia Newhall, Carlin LYNN CNM, MSN 06/14/2014 10:35 PM

## 2014-06-15 LAB — GC/CHLAMYDIA PROBE AMP
CT Probe RNA: NEGATIVE
GC Probe RNA: NEGATIVE

## 2014-06-15 NOTE — Discharge Instructions (Signed)
°  Vaginal Bleeding During Pregnancy, Third Trimester °A small amount of bleeding (spotting) from the vagina is relatively common in pregnancy. Various things can cause bleeding or spotting in pregnancy. Sometimes the bleeding is normal and is not a problem. However, bleeding during the third trimester can also be a sign of something serious for the mother and the baby. Be sure to tell your health care provider about any vaginal bleeding right away.  °Some possible causes of vaginal bleeding during the third trimester include:  °· The placenta may be partially or completely covering the opening to the cervix (placenta previa).   °· The placenta may have separated from the uterus (abruption of the placenta).   °· There may be an infection or growth on the cervix.   °· You may be starting labor, called discharging of the mucus plug.   °· The placenta may grow into the muscle layer of the uterus (placenta accreta).   °HOME CARE INSTRUCTIONS  °Watch your condition for any changes. The following actions may help to lessen any discomfort you are feeling:  °· Follow your health care provider's instructions for limiting your activity. If your health care provider orders bed rest, you may need to stay in bed and only get up to use the bathroom. However, your health care provider may allow you to continue light activity. °· If needed, make plans for someone to help with your regular activities and responsibilities while you are on bed rest. °· Keep track of the number of pads you use each day, how often you change pads, and how soaked (saturated) they are. Write this down. °· Do not use tampons. Do not douche. °· Do not have sexual intercourse or orgasms until approved by your health care provider. °· Follow your health care provider's advice about lifting, driving, and physical activities. °· If you pass any tissue from your vagina, save the tissue so you can show it to your health care provider.   °· Only take  over-the-counter or prescription medicines as directed by your health care provider. °· Do not take aspirin because it can make you bleed.   °· Keep all follow-up appointments as directed by your health care provider. °SEEK MEDICAL CARE IF: °· You have any vaginal bleeding during any part of your pregnancy. °· You have cramps or labor pains. °· You have a fever, not controlled by medicine. °SEEK IMMEDIATE MEDICAL CARE IF:  °· You have severe cramps or pain in your back or belly (abdomen). °· You have chills. °· You have a gush of fluid from the vagina. °· You pass large clots or tissue from your vagina. °· Your bleeding increases. °· You feel light-headed or weak. °· You pass out. °· You feel less movement or no movement of the baby.   °MAKE SURE YOU: °· Understand these instructions. °· Will watch your condition. °· Will get help right away if you are not doing well or get worse. °Document Released: 12/26/2002 Document Revised: 10/10/2013 Document Reviewed: 06/12/2013 °[employer]® Patient Information ©2015 [employer]. This information is not intended to replace advice given to you by your health care provider. Make sure you discuss any questions you have with your health care provider. ° ° °

## 2014-06-15 NOTE — Progress Notes (Signed)
No signs or symptoms of reaction to medication noted.

## 2014-06-16 ENCOUNTER — Encounter (HOSPITAL_COMMUNITY): Payer: Self-pay | Admitting: *Deleted

## 2014-06-16 ENCOUNTER — Inpatient Hospital Stay (HOSPITAL_COMMUNITY)
Admission: AD | Admit: 2014-06-16 | Discharge: 2014-06-16 | Disposition: A | Discharge status: Home / Self Care | Payer: Managed Care, Other (non HMO) | Source: Ambulatory Visit | Attending: Obstetrics & Gynecology | Admitting: Obstetrics & Gynecology

## 2014-06-16 DIAGNOSIS — O4700 False labor before 37 completed weeks of gestation, unspecified trimester: Secondary | ICD-10-CM | POA: Insufficient documentation

## 2014-06-16 LAB — FETAL FIBRONECTIN: FETAL FIBRONECTIN: NEGATIVE

## 2014-06-16 NOTE — Discharge Instructions (Signed)
Braxton Hicks Contractions °Contractions of the uterus can occur throughout pregnancy. Contractions are not always a sign that you are in labor.  °WHAT ARE BRAXTON HICKS CONTRACTIONS?  °Contractions that occur before labor are called Braxton Hicks contractions, or false labor. Toward the end of pregnancy (32-34 weeks), these contractions can develop more often and may become more forceful. This is not true labor because these contractions do not result in opening (dilatation) and thinning of the cervix. They are sometimes difficult to tell apart from true labor because these contractions can be forceful and people have different pain tolerances. You should not feel embarrassed if you go to the hospital with false labor. Sometimes, the only way to tell if you are in true labor is for your health care provider to look for changes in the cervix. °If there are no prenatal problems or other health problems associated with the pregnancy, it is completely safe to be sent home with false labor and await the onset of true labor. °HOW CAN YOU TELL THE DIFFERENCE BETWEEN TRUE AND FALSE LABOR? °False Labor °· The contractions of false labor are usually shorter and not as hard as those of true labor.   °· The contractions are usually irregular.   °· The contractions are often felt in the front of the lower abdomen and in the groin.   °· The contractions may go away when you walk around or change positions while lying down.   °· The contractions get weaker and are shorter lasting as time goes on.   °· The contractions do not usually become progressively stronger, regular, and closer together as with true labor.   °True Labor °· Contractions in true labor last 30-70 seconds, become very regular, usually become more intense, and increase in frequency.   °· The contractions do not go away with walking.   °· The discomfort is usually felt in the top of the uterus and spreads to the lower abdomen and low back.   °· True labor can be  determined by your health care provider with an exam. This will show that the cervix is dilating and getting thinner.   °WHAT TO REMEMBER °· Keep up with your usual exercises and follow other instructions given by your health care provider.   °· Take medicines as directed by your health care provider.   °· Keep your regular prenatal appointments.   °· Eat and drink lightly if you think you are going into labor.   °· If Braxton Hicks contractions are making you uncomfortable:   °· Change your position from lying down or resting to walking, or from walking to resting.   °· Sit and rest in a tub of warm water.   °· Drink 2-3 glasses of water. Dehydration may cause these contractions.   °· Do slow and deep breathing several times an hour.   °WHEN SHOULD I SEEK IMMEDIATE MEDICAL CARE? °Seek immediate medical care if: °· Your contractions become stronger, more regular, and closer together.   °· You have fluid leaking or gushing from your vagina.   °· You have a fever.   °· You pass blood-tinged mucus.   °· You have vaginal bleeding.   °· You have continuous abdominal pain.   °· You have low back pain that you never had before.   °· You feel your baby's head pushing down and causing pelvic pressure.   °· Your baby is not moving as much as it used to.   °Document Released: 10/05/2005 Document Revised: 10/10/2013 Document Reviewed: 07/17/2013 °ExitCare® Patient Information ©2015 [employer]. This information is not intended to replace advice given to you by your health care   provider. Make sure you discuss any questions you have with your health care provider. °Preterm Labor Information °Preterm labor is when labor starts at less than 37 weeks of pregnancy. The normal length of a pregnancy is 39 to 41 weeks. °CAUSES °Often, there is no identifiable underlying cause as to why a woman goes into preterm labor. One of the most common known causes of preterm labor is infection. Infections of the uterus, cervix, vagina, amniotic  sac, bladder, kidney, or even the lungs (pneumonia) can cause labor to start. Other suspected causes of preterm labor include:  °· Urogenital infections, such as yeast infections and bacterial vaginosis.   °· Uterine abnormalities (uterine shape, uterine septum, fibroids, or bleeding from the placenta).   °· A cervix that has been operated on (it may fail to stay closed).   °· Malformations in the fetus.   °· Multiple gestations (twins, triplets, and so on).   °· Breakage of the amniotic sac.   °RISK FACTORS °· Having a previous history of preterm labor.   °· Having premature rupture of membranes (PROM).   °· Having a placenta that covers the opening of the cervix (placenta previa).   °· Having a placenta that separates from the uterus (placental abruption).   °· Having a cervix that is too weak to hold the fetus in the uterus (incompetent cervix).   °· Having too much fluid in the amniotic sac (polyhydramnios).   °· Taking illegal drugs or smoking while pregnant.   °· Not gaining enough weight while pregnant.   °· Being younger than 18 and older than 35 years old.   °· Having a low socioeconomic status.   °· Being African American. °SYMPTOMS °Signs and symptoms of preterm labor include:  °· Menstrual-like cramps, abdominal pain, or back pain. °· Uterine contractions that are regular, as frequent as six in an hour, regardless of their intensity (may be mild or painful). °· Contractions that start on the top of the uterus and spread down to the lower abdomen and back.   °· A sense of increased pelvic pressure.   °· A watery or bloody mucus discharge that comes from the vagina.   °TREATMENT °Depending on the length of the pregnancy and other circumstances, your health care provider may suggest bed rest. If necessary, there are medicines that can be given to stop contractions and to mature the fetal lungs. If labor happens before 34 weeks of pregnancy, a prolonged hospital stay may be recommended. Treatment depends on  the condition of both you and the fetus.  °WHAT SHOULD YOU DO IF YOU THINK YOU ARE IN PRETERM LABOR? °Call your health care provider right away. You will need to go to the hospital to get checked immediately. °HOW CAN YOU PREVENT PRETERM LABOR IN FUTURE PREGNANCIES? °You should:  °· Stop smoking if you smoke.  °· Maintain healthy weight gain and avoid chemicals and drugs that are not necessary. °· Be watchful for any type of infection. °· Inform your health care provider if you have a known history of preterm labor. °Document Released: 12/26/2003 Document Revised: 06/07/2013 Document Reviewed: 11/07/2012 °ExitCare® Patient Information ©2015 [employer]. This information is not intended to replace advice given to you by your health care provider. Make sure you discuss any questions you have with your health care provider. ° °

## 2014-06-16 NOTE — MAU Provider Note (Signed)
Stephanie Wagner is a 33 y.o. G2P1 at 32.5 weeks present to MAU for fFN.  On 06/14/14 she came to MAU c/o contractions, back pain, and diarrhea. Today she reports a little crampy but no ctx "like the other day", lower abd/groin pain that she describes as round ligament pain. She denies LOF, VB, w/+FM.  No Hx of PTL and/or delivery. Denies sex in the last 48 hours.   History    There are no active problems to display for this patient.   Chief Complaint  Patient presents with  . Needs fetal fibronectin    HPI  OB History   Grav Para Term Preterm Abortions TAB SAB Ect Mult Living   Past Medical History  Diagnosis Date  . Tachycardia   . Migraine   . Back pain   . Neck pain   . Chronic headaches   . Seasonal allergies   . Environmental allergies     animals, dander    Past Surgical History  Procedure Laterality Date  . Wisdom tooth extraction      Family History  Problem Relation Age of Onset  . Breast cancer Maternal Grandmother   . Diabetes Father   . Kidney disease Father     transplant  .     Marland Kitchen Heart disease Maternal Grandfather   . Cancer Maternal Grandfather     testicular  . Thyroid disease Mother     History  Substance Use Topics  . Smoking status: Never Smoker   . Smokeless tobacco: Never Used  . Alcohol Use: No    Allergies:  Allergies  Allergen Reactions  . Penicillins Nausea And Vomiting    Prescriptions prior to admission  Medication Sig Dispense Refill  . calcium carbonate (TUMS - DOSED IN MG ELEMENTAL CALCIUM) 500 MG chewable tablet Chew 4-5 tablets by mouth daily as needed for heartburn.      . ferrous sulfate 325 (65 FE) MG tablet Take 325 mg by mouth daily with breakfast.      . Liniments (SALONPAS PAIN RELIEF PATCH) PADS Apply 1 each topically as needed (for pain).      . Prenatal Vit-Fe Fumarate-FA (PRENATAL MULTIVITAMIN) TABS tablet Take 1 tablet by mouth daily at 12 noon.        ROS See HPI above, all other  systems are negative  Physical Exam   Blood pressure 120/79, pulse 121, temperature 98.4 F (36.9 C), temperature source Oral, height  (1.676 m), weight 164 lb (74.39 kg), last menstrual period 08/15/2013.  Physical Exam  Ext:  WNL ABD: Soft, non tender to palpation, no rebound or guarding SVE:  C/T/H   ED Course  Assessment: IUP at  32.5weeks Membranes: FHR: Category 1, FHR 150, + accel, no decel, moderate variability  CTX:  none   Plan: fFN done DC to Home FU in the office in 2 weeks   Oris Staffieri, CNM, MSN 06/16/2014. 9:23 AM

## 2014-06-16 NOTE — MAU Provider Note (Signed)
Results for orders placed during the hospital encounter of 06/16/14 (from the past 24 hour(s))  FETAL FIBRONECTIN     Status: None   Collection Time    06/16/14  9:40 AM      Result Value Ref Range   Fetal Fibronectin NEGATIVE  NEGATIVE    Pt called and informed of lab results Encourage to limit activity to non strenuous activity Keep next schedule ROB   Melvina Pangelinan, CNM, MSN 06/16/2014. 11:56 AM

## 2014-06-16 NOTE — MAU Note (Signed)
Here for fetal fibronectin. Was having ctx's two days ago. Not sent home with any tocolytics.

## 2014-07-16 LAB — OB RESULTS CONSOLE GBS: STREP GROUP B AG: NEGATIVE

## 2014-08-02 ENCOUNTER — Encounter (HOSPITAL_COMMUNITY): Payer: Self-pay | Admitting: *Deleted

## 2014-08-02 ENCOUNTER — Inpatient Hospital Stay (HOSPITAL_COMMUNITY)
Admission: AD | Admit: 2014-08-02 | Discharge: 2014-08-04 | DRG: 775 | Disposition: A | Discharge status: Home / Self Care | Payer: Managed Care, Other (non HMO) | Source: Ambulatory Visit | Attending: Obstetrics & Gynecology | Admitting: Obstetrics & Gynecology

## 2014-08-02 DIAGNOSIS — Z8669 Personal history of other diseases of the nervous system and sense organs: Secondary | ICD-10-CM

## 2014-08-02 DIAGNOSIS — Z880 Allergy status to penicillin: Secondary | ICD-10-CM | POA: Diagnosis not present

## 2014-08-02 DIAGNOSIS — Z3A39 39 weeks gestation of pregnancy: Secondary | ICD-10-CM | POA: Diagnosis present

## 2014-08-02 DIAGNOSIS — O133 Gestational [pregnancy-induced] hypertension without significant proteinuria, third trimester: Secondary | ICD-10-CM | POA: Diagnosis present

## 2014-08-02 DIAGNOSIS — Z833 Family history of diabetes mellitus: Secondary | ICD-10-CM

## 2014-08-02 DIAGNOSIS — O9279 Other disorders of lactation: Secondary | ICD-10-CM | POA: Diagnosis not present

## 2014-08-02 DIAGNOSIS — O2653 Maternal hypotension syndrome, third trimester: Secondary | ICD-10-CM | POA: Diagnosis not present

## 2014-08-02 DIAGNOSIS — O471 False labor at or after 37 completed weeks of gestation: Secondary | ICD-10-CM | POA: Diagnosis present

## 2014-08-02 LAB — CBC
HCT: 34.4 % — ABNORMAL LOW (ref 36.0–46.0)
HCT: 25.1 % — ABNORMAL LOW (ref 36.0–46.0)
HEMOGLOBIN: 11.8 g/dL — AB (ref 12.0–15.0)
Hemoglobin: 8.8 g/dL — ABNORMAL LOW (ref 12.0–15.0)
MCH: 30.1 pg (ref 26.0–34.0)
MCH: 30.8 pg (ref 26.0–34.0)
MCHC: 34.3 g/dL (ref 30.0–36.0)
MCHC: 35.1 g/dL (ref 30.0–36.0)
MCV: 87.8 fL (ref 78.0–100.0)
MCV: 87.8 fL (ref 78.0–100.0)
PLATELETS: 203 10*3/uL (ref 150–400)
Platelets: 230 10*3/uL (ref 150–400)
RBC: 3.92 MIL/uL (ref 3.87–5.11)
RBC: 2.86 MIL/uL — AB (ref 3.87–5.11)
RDW: 13.9 % (ref 11.5–15.5)
RDW: 13.7 % (ref 11.5–15.5)
WBC: 11.9 10*3/uL — ABNORMAL HIGH (ref 4.0–10.5)
WBC: 17.5 10*3/uL — ABNORMAL HIGH (ref 4.0–10.5)

## 2014-08-02 LAB — TYPE AND SCREEN
ABO/RH(D): O POS
ANTIBODY SCREEN: NEGATIVE

## 2014-08-02 LAB — COMPREHENSIVE METABOLIC PANEL
ALBUMIN: 2.9 g/dL — AB (ref 3.5–5.2)
ALK PHOS: 126 U/L — AB (ref 39–117)
ALT: 15 U/L (ref 0–35)
AST: 23 U/L (ref 0–37)
Anion gap: 17 — ABNORMAL HIGH (ref 5–15)
BUN: 9 mg/dL (ref 6–23)
CO2: 18 mEq/L — ABNORMAL LOW (ref 19–32)
Calcium: 8.7 mg/dL (ref 8.4–10.5)
Chloride: 101 mEq/L (ref 96–112)
Creatinine, Ser: 0.6 mg/dL (ref 0.50–1.10)
GFR calc Af Amer: >90 mL/min (ref >90)
GFR calc non Af Amer: >90 mL/min (ref >90)
Glucose, Bld: 98 mg/dL (ref 70–99)
POTASSIUM: 4 meq/L (ref 3.7–5.3)
Sodium: 136 mEq/L — ABNORMAL LOW (ref 137–147)
TOTAL PROTEIN: 6.9 g/dL (ref 6.0–8.3)
Total Bilirubin: 0.3 mg/dL (ref 0.3–1.2)

## 2014-08-02 LAB — LACTATE DEHYDROGENASE: LDH: 160 U/L (ref 94–250)

## 2014-08-02 LAB — RPR

## 2014-08-02 LAB — ABO/RH: ABO/RH(D): O POS

## 2014-08-02 LAB — HIV ANTIBODY (ROUTINE TESTING W REFLEX): HIV: NONREACTIVE

## 2014-08-02 LAB — PROTEIN / CREATININE RATIO, URINE
CREATININE, URINE: 99.93 mg/dL
Protein Creatinine Ratio: 0.16 — ABNORMAL HIGH (ref 0.00–0.15)
Total Protein, Urine: 15.9 mg/dL

## 2014-08-02 LAB — URIC ACID: Uric Acid, Serum: 6.4 mg/dL (ref 2.4–7.0)

## 2014-08-02 MED ORDER — OXYCODONE-ACETAMINOPHEN 5-325 MG PO TABS: 1.0000 | ORAL_TABLET | ORAL | Status: DC | PRN

## 2014-08-02 MED ORDER — OXYCODONE-ACETAMINOPHEN 5-325 MG PO TABS: 2.0000 | ORAL_TABLET | ORAL | Status: DC | PRN

## 2014-08-02 MED ORDER — IBUPROFEN 600 MG PO TABS
600.0000 mg | ORAL_TABLET | Freq: Four times a day (QID) | ORAL | Status: DC
  Administered 2014-08-02 – 2014-08-04 (×8): 600 mg via ORAL
  Filled 2014-08-02 (×8): qty 1

## 2014-08-02 MED ORDER — METHYLERGONOVINE MALEATE 0.2 MG PO TABS
0.2000 mg | ORAL_TABLET | Freq: Four times a day (QID) | ORAL | Status: AC
  Administered 2014-08-02 – 2014-08-03 (×4): 0.2 mg via ORAL
  Filled 2014-08-02 (×4): qty 1

## 2014-08-02 MED ORDER — SIMETHICONE 80 MG PO CHEW: 80.0000 mg | CHEWABLE_TABLET | ORAL | Status: DC | PRN

## 2014-08-02 MED ORDER — ONDANSETRON HCL 4 MG PO TABS: 4.0000 mg | ORAL_TABLET | ORAL | Status: DC | PRN

## 2014-08-02 MED ORDER — LACTATED RINGERS IV SOLN
INTRAVENOUS | Status: DC
Start: 2014-08-02 — End: 2014-08-02
  Administered 2014-08-02 (×2): via INTRAVENOUS

## 2014-08-02 MED ORDER — DIPHENHYDRAMINE HCL 25 MG PO CAPS: 25.0000 mg | ORAL_CAPSULE | Freq: Four times a day (QID) | ORAL | Status: DC | PRN

## 2014-08-02 MED ORDER — BENZOCAINE-MENTHOL 20-0.5 % EX AERO
1.0000 "application " | INHALATION_SPRAY | CUTANEOUS | Status: DC | PRN
  Administered 2014-08-02: 1 via TOPICAL
  Filled 2014-08-02: qty 56

## 2014-08-02 MED ORDER — PRENATAL MULTIVITAMIN CH
1.0000 | ORAL_TABLET | Freq: Every day | ORAL | Status: DC
  Administered 2014-08-03 – 2014-08-04 (×2): 1 via ORAL
  Filled 2014-08-02 (×2): qty 1

## 2014-08-02 MED ORDER — FLEET ENEMA 7-19 GM/118ML RE ENEM: 1.0000 | ENEMA | RECTAL | Status: DC | PRN

## 2014-08-02 MED ORDER — ZOLPIDEM TARTRATE 5 MG PO TABS: 5.0000 mg | ORAL_TABLET | Freq: Every evening | ORAL | Status: DC | PRN

## 2014-08-02 MED ORDER — SENNOSIDES-DOCUSATE SODIUM 8.6-50 MG PO TABS
2.0000 | ORAL_TABLET | ORAL | Status: DC
  Administered 2014-08-02 – 2014-08-03 (×2): 2 via ORAL
  Filled 2014-08-02 (×2): qty 2

## 2014-08-02 MED ORDER — FENTANYL CITRATE 0.05 MG/ML IJ SOLN
100.0000 ug | Freq: Once | INTRAMUSCULAR | Status: AC
  Administered 2014-08-02: 100 ug via INTRAVENOUS

## 2014-08-02 MED ORDER — ONDANSETRON HCL 4 MG/2ML IJ SOLN: 4.0000 mg | INTRAMUSCULAR | Status: DC | PRN

## 2014-08-02 MED ORDER — BUTORPHANOL TARTRATE 1 MG/ML IJ SOLN: 1.0000 mg | INTRAMUSCULAR | Status: DC | PRN

## 2014-08-02 MED ORDER — LACTATED RINGERS IV SOLN
500.0000 mL | INTRAVENOUS | Status: DC | PRN
Start: 2014-08-02 — End: 2014-08-02

## 2014-08-02 MED ORDER — WITCH HAZEL-GLYCERIN EX PADS: 1.0000 "application " | MEDICATED_PAD | CUTANEOUS | Status: DC | PRN

## 2014-08-02 MED ORDER — ACETAMINOPHEN 325 MG PO TABS: 650.0000 mg | ORAL_TABLET | ORAL | Status: DC | PRN

## 2014-08-02 MED ORDER — LANOLIN HYDROUS EX OINT: TOPICAL_OINTMENT | CUTANEOUS | Status: DC | PRN

## 2014-08-02 MED ORDER — OXYTOCIN BOLUS FROM INFUSION
500.0000 mL | INTRAVENOUS | Status: DC
Start: 2014-08-02 — End: 2014-08-02

## 2014-08-02 MED ORDER — DIBUCAINE 1 % RE OINT: 1.0000 "application " | TOPICAL_OINTMENT | RECTAL | Status: DC | PRN

## 2014-08-02 MED ORDER — OXYTOCIN 40 UNITS IN LACTATED RINGERS INFUSION - SIMPLE MED
62.5000 mL/h | INTRAVENOUS | Status: DC
  Administered 2014-08-02: 62.5 mL/h via INTRAVENOUS
  Filled 2014-08-02: qty 1000

## 2014-08-02 MED ORDER — FENTANYL CITRATE 0.05 MG/ML IJ SOLN
INTRAMUSCULAR | Status: AC
  Filled 2014-08-02: qty 2

## 2014-08-02 MED ORDER — CITRIC ACID-SODIUM CITRATE 334-500 MG/5ML PO SOLN: 30.0000 mL | ORAL | Status: DC | PRN

## 2014-08-02 MED ORDER — ONDANSETRON HCL 4 MG/2ML IJ SOLN: 4.0000 mg | Freq: Four times a day (QID) | INTRAMUSCULAR | Status: DC | PRN

## 2014-08-02 MED ORDER — TETANUS-DIPHTH-ACELL PERTUSSIS 5-2.5-18.5 LF-MCG/0.5 IM SUSP: 0.5000 mL | Freq: Once | INTRAMUSCULAR | Status: DC

## 2014-08-02 MED ORDER — LIDOCAINE HCL (PF) 1 % IJ SOLN
30.0000 mL | INTRAMUSCULAR | Status: AC | PRN
  Administered 2014-08-02: 30 mL via SUBCUTANEOUS
  Filled 2014-08-02: qty 30

## 2014-08-02 NOTE — H&P (Signed)
Stephanie Wagner is a 33 y.o. female, G2P1001 at 4139 3/7 weeks, presenting for active labor.  Denies leaking, had some spotting yesterday, reports +FM.  Cervix was 2 cm at last office visit.  Patient Active Problem List   Diagnosis Date Noted  . Normal labor 08/02/2014  . Allergy to penicillin 08/02/2014    History of present pregnancy: Patient entered care at 10 1/7 weeks.   EDC of 08/06/14 was established by LMP.   Anatomy scan:  18 1/7 weeks, with limited anatomy and an posterior placenta.   Additional US evaluations:   20 week, for completion of anatomy:  All anatomy seen and WNL 37 weeks for S>D:  EFW 6LB 10 OZ (63RD%). CEPHALIC. AFI 20 CM. Significant prenatal events:  Seen at Urgent Care for tachycardia at 20 weeks, had normal EKG.  Labs done at [facility] (TSH WNL).  Had periumbilical pain at 23 weeks--umbilical hernia vs diastasis recti.  US at 37 weeks due to S>D, but normal findings.  Declined flu and TDAP. Last evaluation:  07/30/14--cervix 2 cm, 60%, vtx, -3.  OB History   Grav Para Term Preterm Abortions TAB SAB Ect Mult Living   2 1 1       1     2010--SVB, induced at 39 weeks due to elevated BP, 7 hour labor, 6+7, no pain med, delivered by Dr. Estanislado Pandyivard.  Past Medical History  Diagnosis Date  . Tachycardia   . Migraine   . Back pain   . Neck pain   . Chronic headaches   . Seasonal allergies   . Environmental allergies     animals, dander   Past Surgical History  Procedure Laterality Date  . Wisdom tooth extraction     Family History: family history includes Breast cancer in her maternal grandmother; Cancer in her maternal grandfather; Diabetes in her father; Heart disease in her maternal grandfather; Kidney disease in her father; Thyroid disease in her mother.  Social History:  reports that she has never smoked. She has never used smokeless tobacco. She reports that she does not drink alcohol or use illicit drugs.  Patient is Caucasian, stay at home mom, married to FOB  Alycia Rossetti(Ryan), college educated.   Prenatal Transfer Tool  Maternal Diabetes: No Genetic Screening: Normal 1st trimester screen Maternal Ultrasounds/Referrals: Normal Fetal Ultrasounds or other Referrals:  None Maternal Substance Abuse:  No Significant Maternal Medications:  None Significant Maternal Lab Results: Lab values include: Group B Strep negative  Declined flu vaccine and TDAP  ROS:  Contractions, spotting, +FM  Allergies  Allergen Reactions  . Penicillins Nausea And Vomiting  . Claritin [Loratadine] Palpitations    Just during pregnancy     Dilation: 5.5 Effacement (%): 80 Exam by:: V Denicia Pagliarulo CNM Blood pressure 132/98, pulse 110, temperature 98.7 F (37.1 C), temperature source Oral, resp. rate 20, height 5' 6.5" (1.689 m), weight 171 lb (77.565 kg), last menstrual period 08/15/2013.  Chest clear Heart RRR without murmur Abd gravid, NT, FH 39 cm Pelvic: As above, membranes intact, cervix posterior Ext: DTR 2+, no clonus, trace edema  FHR: Category 1, occasional mild variables UCs:  q 3-5 min, moderate  Prenatal labs: ABO, Rh: O/Positive/-- (03/03 0000)O+ Antibody: Negative (03/03 0000)Neg Rubella:   Immune RPR: Nonreactive (03/03 0000) NR HBsAg: Negative (03/03 0000) Neg HIV: Non-reactive (03/03 0000) NR GBS: Negative (09/28 0000)Negative 07/16/14 Sickle cell/Hgb electrophoresis:  NA Pap:  WNL 01/09/14 GC:  Negative 01/15/14 Chlamydia:  Negative 01/15/14 Genetic screenings:  Normal 1st trimester  screen Glucola:  WNL Other:  Hgb 12.9 at NOB, 11.2 at glucola    Assessment/Plan: IUP at 39 3/7 weeks Active labor GBS negative Mild hypertension Declines pain meds  Plan: Admit to Birthing Suite per consult with Dr. Sallye OberKulwa Routine [facility] orders PIH labs and PCR Continuous EFM at present   Phillippa Straub, [place]ICK[place]NM, [place] 08/02/2014, 8:03 AM

## 2014-08-02 NOTE — MAU Note (Signed)
C/o ucs since 0200 this Am; ?SROM since yesterday @ 1700;

## 2014-08-02 NOTE — Lactation Note (Signed)
This note was copied from the chart of Stephanie Wagner. Lactation Consultation Note  Patient Name: Stephanie Unk LightningJessica Castille ZOXWR'UToday's Date: 08/02/2014 Reason for consult: Initial assessment of this second-time mom and her newborn, now 8 hours postpartum.  Baby is sound asleep across mom's chest, STS and mom reports that baby just finished breastfeeding and has been latching well since delivery.  Mom unable to breastfeed first child (unable to latch).  LC encouraged cue feedings and frequent STS.  Mom informs LC that her nurse has already shown her hand expression. LC encouraged review of Baby and Me pp 9, 14 and 20-25 for STS and BF information. LC provided [facility] brochure and reviewed [facility] services and list of community and web site resources.     Maternal Data Formula Feeding for Exclusion: No Has patient been taught Hand Expression?: Yes (mom says her nurse has shown her how to hand express her colostrum) Does the patient have breastfeeding experience prior to this delivery?: No (first baby unable to latch)  Feeding    LATCH Score/Interventions           LATCH scores=8 at two feedings today, per RN assessment           Lactation Tools Discussed/Used   STS, cue feedings, hand expression  Consult Status Consult Status: Follow-up Date: 08/03/14 Follow-up type: In-patient    Warrick ParisianBryant, Nini Cavan [facility] 08/02/2014, 10:01 PM

## 2014-08-02 NOTE — MAU Note (Signed)
Contractions q675min.  No leaking. Spotting since yesterday. Back labor.  Was 2 cm when last checked.

## 2014-08-02 NOTE — Progress Notes (Signed)
Subjective: Now on Birthing Suite--breathing with UCs, husband supportive at bedside.  Patient currently declining ambulation, birthing ball, shower use, etc.  Objective: BP 124/93  Pulse 102  Temp(Src) 98.3 F (36.8 C) (Oral)  Resp 18  Ht 5' 6.5" (1.689 m)  Wt 171 lb (77.565 kg)  BMI 27.19 kg/m2  LMP 08/15/2013   Filed Vitals:   08/02/14 0732 08/02/14 0741 08/02/14 0841  BP:  132/98 124/93  Pulse:  110 102  Temp:  98.7 F (37.1 C) 98.3 F (36.8 C)  TempSrc:  Oral Oral  Resp:  20 18  Height: 5' 6.5" (1.689 m)    Weight: 171 lb (77.565 kg)     Results for orders placed during the hospital encounter of 08/02/14 (from the past 24 hour(s))  PROTEIN / CREATININE RATIO, URINE     Status: Abnormal   Collection Time    08/02/14  7:30 AM      Result Value Ref Range   Creatinine, Urine 99.93     Total Protein, Urine 15.9     Protein Creatinine Ratio 0.16 (*) 0.00 - 0.15  CBC     Status: Abnormal   Collection Time    08/02/14  8:05 AM      Result Value Ref Range   WBC 11.9 (*) 4.0 - 10.5 K/uL   RBC 3.92  3.87 - 5.11 MIL/uL   Hemoglobin 11.8 (*) 12.0 - 15.0 g/dL   HCT 29.534.4 (*) 62.136.0 - 30.846.0 %   MCV 87.8  78.0 - 100.0 fL   MCH 30.1  26.0 - 34.0 pg   MCHC 34.3  30.0 - 36.0 g/dL   RDW 65.713.9  84.611.5 - 96.215.5 %   Platelets 230  150 - 400 K/uL  COMPREHENSIVE METABOLIC PANEL     Status: Abnormal   Collection Time    08/02/14  8:05 AM      Result Value Ref Range   Sodium 136 (*) 137 - 147 mEq/L   Potassium 4.0  3.7 - 5.3 mEq/L   Chloride 101  96 - 112 mEq/L   CO2 18 (*) 19 - 32 mEq/L   Glucose, Bld 98  70 - 99 mg/dL   BUN 9  6 - 23 mg/dL   Creatinine, Ser 9.520.60  0.50 - 1.10 mg/dL   Calcium 8.7  8.4 - 84.110.5 mg/dL   Total Protein 6.9  6.0 - 8.3 g/dL   Albumin 2.9 (*) 3.5 - 5.2 g/dL   AST 23  0 - 37 U/L   ALT 15  0 - 35 U/L   Alkaline Phosphatase 126 (*) 39 - 117 U/L   Total Bilirubin 0.3  0.3 - 1.2 mg/dL   GFR calc non Af Amer >90  >90 mL/min   GFR calc Af Amer >90  >90 mL/min   Anion gap 17 (*) 5 - 15  LACTATE DEHYDROGENASE     Status: None   Collection Time    08/02/14  8:05 AM      Result Value Ref Range   LDH 160  94 - 250 U/L  URIC ACID     Status: None   Collection Time    08/02/14  8:05 AM      Result Value Ref Range   Uric Acid, Serum 6.4  2.4 - 7.0 mg/dL  TYPE AND SCREEN     Status: None   Collection Time    08/02/14  8:05 AM      Result Value Ref Range  ABO/RH(D) O POS     Antibody Screen PENDING     Sample Expiration 08/05/2014          FHT: Category 1 UC:   irregular, every 2-4 minutes SVE:  Deferred at present--last exam at 0748   Assessment:  Active labor Gestational hypertension--no evidence pre-eclampsia GBS negative Declines pain med.  Plan: Continue to observe. Recheck as appropriate Encouraged patient to vary position, other pain relief measures  Dioselina Brumbaugh CNM 08/02/2014, 9:36 AM

## 2014-08-02 NOTE — MAU Note (Signed)
Pt wishes ti labor without an epidural or pain meds; IV start and labs drawn at 0805; admitted to Room 164; Husband with pt and is her support person;

## 2014-08-02 NOTE — Progress Notes (Signed)
Patient up to the bathroom to void.  Patient stated she feels fine.  Passed three orange sized clots while in the bathroom.  Patient stood to walk back to the bed and started to feel faint and had ringing in her ears.  Patient placed on stedy to get back to bed.  Patient passed out at this time.  Ammonia used.  Two more clots expressed once in the bed.  Fundus firm at 3 below the umbilicus.  Patient stated that she was feeling better at this time.  Vitals taken and IVF started with LR at 500mL bolus.  Nigel BridgemanVicki Latham, CNM notified of patient's status.  Continue with IVF and Chip BoerVicki to assess soon. Cox, Lurlean Kernen M

## 2014-08-02 NOTE — Progress Notes (Signed)
RN called to inform me of patient's syncopal episode on 2nd trip to [facility] on MBU. Feeling better now. Passed several clots, bleeding minimal after event. Able to empty bladder. Uterus firm, 2 below umbilicus. Blood loss approx 300 cc.  Filed Vitals:   08/02/14 1615 08/02/14 1630 08/02/14 1632 08/02/14 1700  BP: 119/72 127/73 105/70 121/74  Pulse: 103 91 101 101  Temp: 98.9 F (37.2 C)   98.5 F (36.9 C)  TempSrc: Oral   Oral  Resp: 18   20  Height:      Weight:      SpO2:  99% 98% 99%   Plan: Continue IV hydration Methergine 0.2 mg po QID x 4 doses CBC now. Continue close observation.  Nigel BridgemanVicki Shevaun Lovan, CNM 08/02/14 6:15p

## 2014-08-03 LAB — CBC
HCT: 25.6 % — ABNORMAL LOW (ref 36.0–46.0)
Hemoglobin: 8.8 g/dL — ABNORMAL LOW (ref 12.0–15.0)
MCH: 30 pg (ref 26.0–34.0)
MCHC: 34.4 g/dL (ref 30.0–36.0)
MCV: 87.4 fL (ref 78.0–100.0)
Platelets: 212 10*3/uL (ref 150–400)
RBC: 2.93 MIL/uL — ABNORMAL LOW (ref 3.87–5.11)
RDW: 13.8 % (ref 11.5–15.5)
WBC: 16.9 10*3/uL — AB (ref 4.0–10.5)

## 2014-08-03 NOTE — Progress Notes (Signed)
Stephanie Wagner  Post Partum Day 1:S/P SVD with 2nd Degree and Syncope Episode  Subjective: Patient up ad lib, denies syncope or dizziness at current. Reports consuming regular diet without issues and denies N/V. No issues with urination, bm this am, and reports bleeding is "better" and that she has not passed any large clots since incident at 0300. Breastfeeding without issues.  Managing uterine pain with ibuprofen.  States husband is to get a vasectomy for birth control method.    Objective: Blood pressure 135/80, pulse 99, temperature 98.3 F (36.8 C), temperature source Oral, resp. rate 20, height 5' 6.5" (1.689 m), weight 171 lb (77.565 kg), last menstrual period 08/15/2013, SpO2 99.00%, unknown if currently breastfeeding.  Physical Exam:  General: alert, cooperative and no distress Lochia: appropriate Uterine Fundus: firm U/-2 Incision: N/A DVT Evaluation: No evidence of DVT seen on physical exam. Negative Homan's sign. No cords or calf tenderness.   Recent Labs  08/02/14 1804 08/03/14 0535  HGB 8.8* 8.8*  HCT 25.1* 25.6*    Assessment S/P Vaginal Delivery-Day 1 Normal Involution Methergine Series Breastfeeding S/P Syncope Episode Hemodynamically Stable  Plan: Plan for discharge tomorrow Continue current care as ordered Dr. Lance MorinA. Roberts to be updated on patient status   Helyne Genther, Leighla LYNN, CNM 08/03/2014, 11:46 AM

## 2014-08-03 NOTE — Lactation Note (Signed)
This note was copied from the chart of Girl Unk LightningJessica Wirkkala. Lactation Consultation Note  Patient Name: Girl Unk LightningJessica Scarantino WUJWJ'XToday's Date: 08/03/2014 Reason for consult: Follow-up assessment  Infant 28 hrs old. Infant being held by grandmother.  Mom stated infant is breastfeeding well; cluster feeding.  Mom is pleased with breastfeeding and states this infant is feeding better than her first child.  Infant has breastfed x10 (15-60) in past 24 hours; voids-3; stools-5 in past 24 hours; only 2% weight loss.  Mom c/o slight tenderness.  Hand expression taught but did not express long enough to get colostrum; encouraged mom to hand express after each feeding for nipple care.  Educated mom on cluster feeding, size of infant's stomach, and encouraged her to continue feeding with feeding cues.  Discussed supply/ demand and milk production.  Comfort gels given and educated on use.  Encouraged to call for assistance if needed.     Maternal Data Has patient been taught Hand Expression?: Yes  Feeding Feeding Type: Breast Fed Length of feed: 30 min     Problem noted: Mild/Moderate discomfort Interventions (Mild/moderate discomfort): Comfort gels      Consult Status Consult Status: Follow-up Date: 08/04/14 Follow-up type: In-patient    Lendon KaVann, Dontavius Keim Walker 08/03/2014, 5:50 PM

## 2014-08-03 NOTE — Progress Notes (Signed)
Pt. Up to bathroom pt. Stated she passed a small clot in toliet and flushed it before rn entered room fundal check firm and 2U bleeding small amt on pad will cont, to assess

## 2014-08-03 NOTE — Progress Notes (Signed)
Pt. Got up to bathroom and stated she passed an orange size clot in toliet  Assisted pt. Back to bed fundal check was firm and 2below bleeding small vss pt. Alert and color pink skin w/d

## 2014-08-04 DIAGNOSIS — O9279 Other disorders of lactation: Secondary | ICD-10-CM | POA: Diagnosis not present

## 2014-08-04 MED ORDER — IBUPROFEN 600 MG PO TABS: 600.0000 mg | ORAL_TABLET | Freq: Four times a day (QID) | ORAL | Status: DC

## 2014-08-04 NOTE — Lactation Note (Signed)
This note was copied from the chart of Stephanie Unk LightningJessica Dejarnett. Lactation Consultation Note  Patient Name: Stephanie Wagner ZOXWR'UToday's Date: 08/04/2014 Reason for consult: Follow-up assessment;Other (Comment) (engorged, will F/U with mom after icing ) Reported to this LC mom is sore ( nipples and breast ). LC assessed breast tissue with moms permission And noted breast to be boarder line engorged bilaterally , tender to touch , especially the lateral aspects, and nipples pinky red . Discussed with mom and dad , sore nipple and engorgement prevention and tx. MBU RN - Boykin PeekNancy Austin aware  and plans to fix mom some ice packs ( for icing 20 mins and to set up with a DEBP).  This LC will re-check mom. Baby last fed at 0900 20 ml of formula , and may or may not wake up for a feeding in the next 2 hrs.     Maternal Data    Feeding Feeding Type:  (per mom recently at 0900 supplemented with formula )  LATCH Score/Interventions          Comfort (Breast/Nipple):  (per mom breast are tender and sore , LC noted to be boarder line engorged )  Problem noted: Filling  Intervention(s): Breastfeeding basics reviewed (see LC note )     Lactation Tools Discussed/Used     Consult Status Consult Status: Follow-up Date: 08/04/14 Follow-up type: In-patient    Kathrin Greathouseorio, Fern Asmar Ann 08/04/2014, 9:57 AM

## 2014-08-04 NOTE — Lactation Note (Signed)
This note was copied from the chart of Stephanie Wagner. Lactation Consultation Note  Patient Name: Stephanie Unk LightningJessica Gaccione ZOXWR'UToday's Date: 08/04/2014 Reason for consult: Follow-up assessment 2nd for engorgement tx , mom has been icing, pumping [facility] results [facility] ,  LC assisted with latch on the right breast , 1st tried cross cradle and switched to football to work on the outer  aspect of the breast breast due to full nodules. Breast tissue softened, still small area of border line engorgement, Baby released and assisted with latch on the left breast with firm support and depth. Swallows noted. Moist warm compressed to aid with let down , and it helped. Per mom only got an hour sleep last night, LC suggested  After feeding to be icing and to take a nap to help let down.      Maternal Data    Feeding Feeding Type: Breast Fed (left breast / re-latched ) Length of feed: 10 min  LATCH Score/Interventions Latch: Grasps breast easily, tongue down, lips flanged, rhythmical sucking. Intervention(s): Adjust position;Assist with latch;Breast massage;Breast compression  Audible Swallowing: Spontaneous and intermittent  Type of Nipple: Everted at rest and after stimulation  Comfort (Breast/Nipple): Filling, red/small blisters or bruises, mild/mod discomfort  Problem noted: Filling Interventions (Filling): Massage;Firm support Interventions (Mild/moderate discomfort): Pre-pump if needed  Hold (Positioning): Assistance needed to correctly position infant at breast and maintain latch. Intervention(s): Breastfeeding basics reviewed;Support Pillows;Position options  LATCH Score: 8  Lactation Tools Discussed/Used WIC Program: No   Consult Status Consult Status: Follow-up Date: 08/04/14 Follow-up type: In-patient    Kathrin Greathouseorio, Ransom Nickson Ann 08/04/2014, 12:16 PM

## 2014-08-04 NOTE — Discharge Instructions (Signed)
Iron-Rich Diet °An iron-rich diet contains foods that are good sources of iron. Iron is an important mineral that helps your body produce hemoglobin. Hemoglobin is a protein in red blood cells that carries oxygen to the body's tissues. Sometimes, the iron level in your blood can be low. This may be caused by: °· A lack of iron in your diet. °· Blood loss. °· Times of growth, such as during pregnancy or during a child's growth and development. °Low levels of iron can cause a decrease in the number of red blood cells. This can result in iron deficiency anemia. Iron deficiency anemia symptoms include: °· Tiredness. °· Weakness. °· Irritability. °· Increased chance of infection. °Here are some recommendations for daily iron intake: °· Males older than 35 years of age need 8 mg of iron per day. °· Women ages 19 to 50 need 18 mg of iron per day. °· Pregnant women need 27 mg of iron per day, and women who are over 19 years of age and breastfeeding need 9 mg of iron per day. °· Women over the age of 50 need 8 mg of iron per day. °SOURCES OF IRON °There are 2 types of iron that are found in food: heme iron and nonheme iron. Heme iron is absorbed by the body better than nonheme iron. Heme iron is found in meat, poultry, and fish. Nonheme iron is found in grains, beans, and vegetables. °Heme Iron Sources °Food / Iron (mg) °· Chicken liver, 3 oz (85 g)/ 10 mg °· Beef liver, 3 oz (85 g)/ 5.5 mg °· Oysters, 3 oz (85 g)/ 8 mg °· Beef, 3 oz (85 g)/ 2 to 3 mg °· Shrimp, 3 oz (85 g)/ 2.8 mg °· Turkey, 3 oz (85 g)/ 2 mg °· Chicken, 3 oz (85 g) / 1 mg °· Fish (tuna, halibut), 3 oz (85 g)/ 1 mg °· Pork, 3 oz (85 g)/ 0.9 mg °Nonheme Iron Sources °Food / Iron (mg) °· Ready-to-eat breakfast cereal, iron-fortified / 3.9 to 7 mg °· Tofu, ½ cup / 3.4 mg °· Kidney beans, ½ cup / 2.6 mg °· Baked potato with skin / 2.7 mg °· Asparagus, ½ cup / 2.2 mg °· Avocado / 2 mg °· Dried peaches, ½ cup / 1.6 mg °· Raisins, ½ cup / 1.5 mg °· Soy milk, 1 cup  / 1.5 mg °· Whole-wheat bread, 1 slice / 1.2 mg °· Spinach, 1 cup / 0.8 mg °· Broccoli, ½ cup / 0.6 mg °IRON ABSORPTION °Certain foods can decrease the body's absorption of iron. Try to avoid these foods and beverages while eating meals with iron-containing foods: °· Coffee. °· Tea. °· Fiber. °· Soy. °Foods containing vitamin C can help increase the amount of iron your body absorbs from iron sources, especially from nonheme sources. Eat foods with vitamin C along with iron-containing foods to increase your iron absorption. Foods that are high in vitamin C include many fruits and vegetables. Some good sources are: °· Fresh orange juice. °· Oranges. °· Strawberries. °· Mangoes. °· Grapefruit. °· Red bell peppers. °· Green bell peppers. °· Broccoli. °· Potatoes with skin. °· Tomato juice. °Document Released: 05/19/2005 Document Revised: 12/28/2011 Document Reviewed: 03/26/2011 °[employer]® Patient Information ©2015 [employer]. This information is not intended to replace advice given to you by your health care provider. Make sure you discuss any questions you have with your health care provider. °Postpartum Depression and Baby Blues °The postpartum period begins right after the birth of a baby.   During this time, there is often a great amount of joy and excitement. It is also a time of many changes in the life of the parents. Regardless of how many times a mother gives birth, each child brings new challenges and dynamics to the family. It is not unusual to have feelings of excitement along with confusing shifts in moods, emotions, and thoughts. All mothers are at risk of developing postpartum depression or the "baby blues." These mood changes can occur right after giving birth, or they may occur many months after giving birth. The baby blues or postpartum depression can be mild or severe. Additionally, postpartum depression can go away rather quickly, or it can be a long-term condition.  °CAUSES °Raised hormone levels  and the rapid drop in those levels are thought to be a main cause of postpartum depression and the baby blues. A number of hormones change during and after pregnancy. Estrogen and progesterone usually decrease right after the delivery of your baby. The levels of thyroid hormone and various cortisol steroids also rapidly drop. Other factors that play a role in these mood changes include major life events and genetics.  °RISK FACTORS °If you have any of the following risks for the baby blues or postpartum depression, know what symptoms to watch out for during the postpartum period. Risk factors that may increase the likelihood of getting the baby blues or postpartum depression include: °· Having a personal or family history of depression.   °· Having depression while being pregnant.   °· Having premenstrual mood issues or mood issues related to oral contraceptives. °· Having a lot of life stress.   °· Having marital conflict.   °· Lacking a social support network.   °· Having a baby with special needs.   °· Having health problems, such as diabetes.   °SIGNS AND SYMPTOMS °Symptoms of baby blues include: °· Brief changes in mood, such as going from extreme happiness to sadness. °· Decreased concentration.   °· Difficulty sleeping.   °· Crying spells, tearfulness.   °· Irritability.   °· Anxiety.   °Symptoms of postpartum depression typically begin within the first month after giving birth. These symptoms include: °· Difficulty sleeping or excessive sleepiness.   °· Marked weight loss.   °· Agitation.   °· Feelings of worthlessness.   °· Lack of interest in activity or food.   °Postpartum psychosis is a very serious condition and can be dangerous. Fortunately, it is rare. Displaying any of the following symptoms is cause for immediate medical attention. Symptoms of postpartum psychosis include:  °· Hallucinations and delusions.   °· Bizarre or disorganized behavior.   °· Confusion or disorientation.   °DIAGNOSIS  °A  diagnosis is made by an evaluation of your symptoms. There are no medical or lab tests that lead to a diagnosis, but there are various questionnaires that a health care provider may use to identify those with the baby blues, postpartum depression, or psychosis. Often, a screening tool called the Edinburgh Postnatal Depression Scale is used to diagnose depression in the postpartum period.  °TREATMENT °The baby blues usually goes away on its own in 1-2 weeks. Social support is often all that is needed. You will be encouraged to get adequate sleep and rest. Occasionally, you may be given medicines to help you sleep.  °Postpartum depression requires treatment because it can last several months or longer if it is not treated. Treatment may include individual or group therapy, medicine, or both to address any social, physiological, and psychological factors that may play a role in the depression. Regular exercise, a healthy diet, rest,   and social support may also be strongly recommended.  Postpartum psychosis is more serious and needs treatment right away. Hospitalization is often needed. HOME CARE INSTRUCTIONS  Get as much rest as you can. Nap when the baby sleeps.   Exercise regularly. Some women find yoga and walking to be beneficial.   Eat a balanced and nourishing diet.   Do little things that you enjoy. Have a cup of tea, take a bubble bath, read your favorite magazine, or listen to your favorite music.  Avoid alcohol.   Ask for help with household chores, cooking, grocery shopping, or running errands as needed. Do not try to do everything.   Talk to people close to you about how you are feeling. Get support from your partner, family members, friends, or other new moms.  Try to stay positive in how you think. Think about the things you are grateful for.   Do not spend a lot of time alone.   Only take over-the-counter or prescription medicine as directed by your health care  provider.  Keep all your postpartum appointments.   Let your health care provider know if you have any concerns.  SEEK MEDICAL CARE IF: You are having a reaction to or problems with your medicine. SEEK IMMEDIATE MEDICAL CARE IF:  You have suicidal feelings.   You think you may harm the baby or someone else. MAKE SURE YOU:  Understand these instructions.  Will watch your condition.  Will get help right away if you are not doing well or get worse. Document Released: 07/09/2004 Document Revised: 10/10/2013 Document Reviewed: 07/17/2013 [facility] Patient Information 2015 [place], [place]. This information is not intended to replace advice given to you by your health care provider. Make sure you discuss any questions you have with your health care provider. Breast Engorgement  Breast engorgement is the overfilling of your breasts with breast milk. In the first few weeks after giving birth, you may experience breast engorgement. Although it is normal for your breasts to feel heavy, full, and uncomfortable within 3-5 days of giving birth, breast engorgement can make your breasts throb and feel hard, tightly stretched, warm, and tender. Engorgement peaks about the fifth day after you give birth. Breast engorgement can be easily treated and does not require you to stop breastfeeding.  CAUSES OF BREAST ENGORGEMENT Some women delay feedings because of sore or cracked nipples, which can lead to engorgement. Cracked and sore nipples often are caused by inadequate latching (when your baby's mouth attaches to your breast to breastfeed). If your baby is latched on properly, he or she should be able to breastfeed as long as needed, without causing any pain. If you do feel pain while breastfeeding, take your baby off your breast and try again. Get help from your health care provider or a lactation consultant if you continue to have pain. Other causes of engorgement include:   Improper position of your baby  while breastfeeding.  Allowing too much time to pass between feedings.  Reduction in breastfeeding because you give your baby water, juice, formula, breast milk from a bottle, or a pacifier instead of breastfeeding.  Changes in your baby's feeding patterns.  Weak sucking from your baby, which causes less milk to be taken out of your breast during feedings.  Fatigue, stress, anemia.  Plugged milk ducts.  A history of breast surgery. SIGNS AND SYMPTOMS OF BREAST ENGORGEMENT If your breasts become engorged, you may experience:   Breast swelling, tenderness, warmth, redness, or throbbing.  Breast hardness and stretching of the skin around your breast.  Flattening, tightening, and hardening of your nipple.  A low-grade fever, which can be confused with a breast infection. RECOMMENDATIONS TO EASE BREAST ENGORGEMENT Breast engorgement should improve in 24-48 hours after following these recommendations:  Breastfeed when you feel the need to reduce the fullness of your breasts or when your baby shows signs of hunger. This is called "breastfeeding on demand."  Newborns (babies younger than 4 weeks) often breastfeed every 1-3 hours during the day. You may need to awaken your baby to feed if he or she is asleep at a feeding time.  Do not allow your baby to sleep longer than 5 hours during the night without a feeding.  Pump or hand-express breast milk before breastfeeding to soften your breast, areola, and nipple.   Apply warm, moist heat (in the shower or with warm water-soaked hand towels) just before feeding or pumping, or massage your breast before or during breastfeeding. This increases circulation and helps your milk to flow.  Completely empty your breasts when breastfeeding or pumping. Afterward, wear a snug bra (nursing or regular) or tank top for 1-2 days to signal your body to slightly decrease milk production. Only wear snug bras or tank tops to treat engorgement. Tight bras  typically should be avoided by breastfeeding mothers. Once engorgement is relieved, return to wearing regular, loose-fitting clothes.  Apply ice packs to your breasts to lessen the pain from engorgement and relieve swelling, unless the ice is uncomfortable for you.  Do not delay feedings. Try to relax when it is time to feed your baby. This helps to trigger your "let-down reflex," which releases milk from your breast.  Ensure your baby is latched on to your breast and positioned properly while breastfeeding.   Allow your baby to remain at your breast as long as he or she is latched on well and actively sucking. Your baby will let you know when he or she is done breastfeeding by pulling away from your breast or falling asleep.  Avoid introducing bottles or pacifiers to your baby in the early weeks of breastfeeding. Wait to introduce these things until after resolving any breastfeeding challenges.  Try to pump your milk on the same schedule as when your baby would breastfeed if you are returning to work or away from home for an extended period.  Drink plenty of fluids to avoid dehydration, which can eventually put you at greater risk of breast engorgement.  CALL YOUR HEALTH CARE PROVIDER OR LACTATION CONSULTANT IF:   Engorgement lasts longer than 2 days, even after treatment.  You have flu-like symptoms, such as a fever, chills, or body aches.  Your breasts become increasingly red and painful. Document Released: 01/30/2005 Document Revised: 06/07/2013 Document Reviewed: 03/30/2013 [facility] Patient Information 2015 [place], [place]. This information is not intended to replace advice given to you by your health care provider. Make sure you discuss any questions you have with your health care provider.

## 2014-08-04 NOTE — Discharge Summary (Signed)
Vaginal Delivery Discharge Summary  Stephanie LightningJessica Wagner  DOB:    April 21, 1981 MRN:    [id number] CSN:    [id number]  Date of admission:                  08/02/2014  Date of discharge:                   08/04/2014  Procedures this admission: SVD with 2nd Degree Laceration, Methergine Series  Date of Delivery: 08/02/2014  Newborn Data:  Live born female  Birth Weight: 8 lb 4.6 oz (3759 g) APGAR: 8, 9  Home with mother. Name: Stephanie Wagner Circumcision Plan: N/A  History of Present Illness:  Ms. Stephanie Wagner is a 33 y.o. female, G2P2002, who presents at 7138w3d weeks gestation. The [facility]. She was admitted onset of labor. Her pregnancy has been complicated by:   Patient Active Problem List   Diagnosis Date Noted  . Breast engorgement, postpartum 08/04/2014  . Second-degree perineal laceration, with delivery 08/04/2014  . Allergy to penicillin 08/02/2014  . Hx of migraines 08/02/2014  . Vaginal delivery 08/02/2014     Hospital course:  The patient was admitted for active labor.   Her labor was not complicated. She was GBS Negative and did not utilize pain medication during her labor process.  She proceeded to have a vaginal delivery of a healthy infant. Her delivery was not complicated, but patient did suffer from a second degree perineal laceration. Her postpartum course was complicated as patient had an episode of syncope and passing of large clots on two occassions. Therefore patient was started on a 24hr methergine series and CBC revealed anemia, which iron supplementation was prescribed.  She was discharged to home on postpartum day 2 and noted to have breast engorgement.  Patient was educated on breast emptying, mastitis, and complications requiring follow-up.    Feeding:  breast  Contraception:  vasectomy  Discharge hemoglobin:  Hemoglobin  Date Value Ref Range Status   08/03/2014 8.8* 12.0 - 15.0 g/dL Final     HCT  Date Value Ref Range Status  08/03/2014 25.6* 36.0 - 46.0 % Final    Discharge Physical Exam:   General: alert, cooperative and mild distress Breast: Engorged, no redness, nipples intact Lochia: appropriate Uterine Fundus: firm, U/-3 Incision: N/A DVT Evaluation: No evidence of DVT seen on physical exam. Negative Homan's sign. No cords or calf tenderness.  Intrapartum Procedures: spontaneous vaginal delivery Postpartum Procedures: Methergine Complications-Operative and Postpartum: 2nd degree perineal laceration  Discharge Diagnoses: Term Pregnancy-delivered  Discharge Information:  Activity:           pelvic rest Diet:                routine Medications: PNV, Ibuprofen and Iron Condition:      stable Instructions:  Pain Management, Peri-Care, Breastfeeding and Breast care, Who and When to call for postpartum complications. ] Postpartum Care After Vaginal Delivery After you deliver your newborn (postpartum period), the usual stay in the hospital is 24 72 hours. If there were problems with your labor or delivery, or if you have other medical problems, you might be in the hospital longer.  While you are in the hospital, you will receive help and instructions on how to care for yourself and your newborn during the postpartum period.  While you are in the hospital:  Be sure to tell your nurses if you  have pain or discomfort, as well as where you feel the pain and what makes the pain worse.  If you had an incision made near your vagina (episiotomy) or if you had some tearing during delivery, the nurses may put ice packs on your episiotomy or tear. The ice packs may help to reduce the pain and swelling.  If you are breastfeeding, you may feel uncomfortable contractions of your uterus for a couple of weeks. This is normal. The contractions help your uterus get back to normal size.  It is normal to have some bleeding after  delivery.  For the first 1 3 days after delivery, the flow is red and the amount may be similar to a period.  It is common for the flow to start and stop.  In the first few days, you may pass some small clots. Let your nurses know if you begin to pass large clots or your flow increases.  Do not  flush blood clots down the toilet before having the nurse look at them.  During the next 3 10 days after delivery, your flow should become more watery and pink or brown-tinged in color.  Ten to fourteen days after delivery, your flow should be a small amount of yellowish-white discharge.  The amount of your flow will decrease over the first few weeks after delivery. Your flow may stop in 6 8 weeks. Most women have had their flow stop by 12 weeks after delivery.  You should change your sanitary pads frequently.  Wash your hands thoroughly with soap and water for at least 20 seconds after changing pads, using the toilet, or before holding or feeding your newborn.  You should feel like you need to empty your bladder within the first 6 8 hours after delivery.  In case you become weak, lightheaded, or faint, call your nurse before you get out of bed for the first time and before you take a shower for the first time.  Within the first few days after delivery, your breasts may begin to feel tender and full. This is called engorgement. Breast tenderness usually goes away within 48 72 hours after engorgement occurs. You may also notice milk leaking from your breasts. If you are not breastfeeding, do not stimulate your breasts. Breast stimulation can make your breasts produce more milk.  Spending as much time as possible with your newborn is very important. During this time, you and your newborn can feel close and get to know each other. Having your newborn stay in your room (rooming in) will help to strengthen the bond with your newborn. It will give you time to get to know your newborn and become  comfortable caring for your newborn.  Your hormones change after delivery. Sometimes the hormone changes can temporarily cause you to feel sad or tearful. These feelings should not last more than a few days. If these feelings last longer than that, you should talk to your caregiver.  If desired, talk to your caregiver about methods of family planning or contraception.  Talk to your caregiver about immunizations. Your caregiver may want you to have the following immunizations before leaving the hospital:  Tetanus, diphtheria, and pertussis (Tdap) or tetanus and diphtheria (Td) immunization. It is very important that you and your family (including grandparents) or others caring for your newborn are up-to-date with the Tdap or Td immunizations. The Tdap or Td immunization can help protect your newborn from getting ill.  Rubella immunization.  Varicella (chickenpox) immunization.  Influenza  immunization. You should receive this annual immunization if you did not receive the immunization during your pregnancy. Document Released: 08/02/2007 Document Revised: 06/29/2012 Document Reviewed: 06/01/2012 [facility] Patient Information 2014 [place], [place].   Postpartum Depression and Baby Blues  The postpartum period begins right after the birth of a baby. During this time, there is often a great amount of joy and excitement. It is also a time of considerable changes in the life of the parent(s). Regardless of how many times a mother gives birth, each child brings new challenges and dynamics to the family. It is not unusual to have feelings of excitement accompanied by confusing shifts in moods, emotions, and thoughts. All mothers are at risk of developing postpartum depression or the "baby blues." These mood changes can occur right after giving birth, or they may occur many months after giving birth. The baby blues or postpartum depression can be mild or severe. Additionally, postpartum depression can  resolve rather quickly, or it can be a long-term condition. CAUSES Elevated hormones and their rapid decline are thought to be a main cause of postpartum depression and the baby blues. There are a number of hormones that radically change during and after pregnancy. Estrogen and progesterone usually decrease immediately after delivering your baby. The level of thyroid hormone and various cortisol steroids also rapidly drop. Other factors that play a major role in these changes include major life events and genetics.  RISK FACTORS If you have any of the following risks for the baby blues or postpartum depression, know what symptoms to watch out for during the postpartum period. Risk factors that may increase the likelihood of getting the baby blues or postpartum depression include:  Havinga personal or family history of depression.  Having depression while being pregnant.  Having premenstrual or oral contraceptive-associated mood issues.  Having exceptional life stress.  Having marital conflict.  Lacking a social support network.  Having a baby with special needs.  Having health problems such as diabetes. SYMPTOMS Baby blues symptoms include:  Brief fluctuations in mood, such as going from extreme happiness to sadness.  Decreased concentration.  Difficulty sleeping.  Crying spells, tearfulness.  Irritability.  Anxiety. Postpartum depression symptoms typically begin within the first month after giving birth. These symptoms include:  Difficulty sleeping or excessive sleepiness.  Marked weight loss.  Agitation.  Feelings of worthlessness.  Lack of interest in activity or food. Postpartum psychosis is a very concerning condition and can be dangerous. Fortunately, it is rare. Displaying any of the following symptoms is cause for immediate medical attention. Postpartum psychosis symptoms include:  Hallucinations and delusions.  Bizarre or disorganized behavior.  Confusion  or disorientation. DIAGNOSIS  A diagnosis is made by an evaluation of your symptoms. There are no medical or lab tests that lead to a diagnosis, but there are various questionnaires that a caregiver may use to identify those with the baby blues, postpartum depression, or psychosis. Often times, a screening tool called the [place] Postnatal Depression Scale is used to diagnose depression in the postpartum period.  TREATMENT The baby blues usually goes away on its own in 1 to 2 weeks. Social support is often all that is needed. You should be encouraged to get adequate sleep and rest. Occasionally, you may be given medicines to help you sleep.  Postpartum depression requires treatment as it can last several months or longer if it is not treated. Treatment may include individual or group therapy, medicine, or both to address any social, physiological, and psychological  factors that may play a role in the depression. Regular exercise, a healthy diet, rest, and social support may also be strongly recommended.  Postpartum psychosis is more serious and needs treatment right away. Hospitalization is often needed. HOME CARE INSTRUCTIONS  Get as much rest as you can. Nap when the baby sleeps.  Exercise regularly. Some women find yoga and walking to be beneficial.  Eat a balanced and nourishing diet.  Do little things that you enjoy. Have a cup of tea, take a bubble bath, read your favorite magazine, or listen to your favorite music.  Avoid alcohol.  Ask for help with household chores, cooking, grocery shopping, or running errands as needed. Do not try to do everything.  Talk to people close to you about how you are feeling. Get support from your partner, family members, friends, or other new moms.  Try to stay positive in how you think. Think about the things you are grateful for.  Do not spend a lot of time alone.  Only take medicine as directed by your caregiver.  Keep all your postpartum  appointments.  Let your caregiver know if you have any concerns. SEEK MEDICAL CARE IF: You are having a reaction or problems with your medicine. SEEK IMMEDIATE MEDICAL CARE IF:  You have suicidal feelings.  You feel you may harm the baby or someone else. Document Released: 07/09/2004 Document Revised: 12/28/2011 Document Reviewed: 08/11/2011 [facility] Patient Information 2014 [place].   Discharge to: home  Follow-up Information   [facility]. Schedule an appointment as soon as possible for a visit in 6 weeks. (Please call if you have any questions or concerns prior to your next visit.)    Specialty:  Obstetrics and Gynecology   Contact information:   [address] [place] [id number] [phone]       Marlene BastMLY, Dalma LYNN CNM, MSN 08/04/2014

## 2014-08-04 NOTE — Lactation Note (Signed)
This note was copied from the chart of Girl Unk LightningJessica Phifer. Lactation Consultation Note  Patient Name: Girl Unk LightningJessica Pappalardo ZOXWR'UToday's Date: 08/04/2014 Reason for consult: Follow-up assessment;Other (Comment) (engorgement resolving , will take some work at home , see LC note ) LC plan of care  For home - due to mom feeling exhausted - encouraged naps, plenty fluids, especially water , nutritious snacks and meals                                                 - Feedings - By 2-3 hours if baby isn't showing feeding cues to check diaper, place baby skin to skin ,                                                 - Feed with feeding cues                                                 - Steps for latching -  breast massage, hand express, pre-pump if needed for over fullness, breast compressions with latch until comfortable/then intermittent                                                 - Engorgement prevention - if breast are greater than full heading towards firm ICE for 15 -20 mins and then steps for latching                                                 - Feed approximately every 2-3 hours , for 15 -20mins on the 1st breast and offer the  2nd breast                                                 - Baby has to get use to the volume , and may not always feed on both breast , release 2nd breast down as needed to prevent engorgement.                                                     Maternal Data    Feeding Feeding Type: Breast Milk Length of feed: 30 min  LATCH Score/Interventions Latch: Grasps breast easily, tongue down, lips flanged, rhythmical sucking. Intervention(s): Adjust position;Assist with latch;Breast massage;Breast compression  Audible Swallowing: Spontaneous and intermittent  Type of Nipple:  (appears normal when baby released )  Comfort (Breast/Nipple): Filling, red/small blisters or bruises, mild/mod discomfort  Problem noted: Filling Interventions (Filling):  Massage;Firm support Interventions (  Mild/moderate discomfort): Pre-pump if needed  Hold (Positioning): Assistance needed to correctly position infant at breast and maintain latch. Intervention(s): Breastfeeding basics reviewed (see LC plan in LC note )  LATCH Score: 8  Lactation Tools Discussed/Used WIC Program: No   Consult Status Consult Status: Complete Date: 08/04/14 Follow-up type: In-patient    Kathrin Greathouseorio, Deshonda Cryderman Ann 08/04/2014, 2:49 PM

## 2014-08-20 ENCOUNTER — Encounter (HOSPITAL_COMMUNITY): Payer: Self-pay | Admitting: *Deleted

## 2014-08-30 ENCOUNTER — Inpatient Hospital Stay (HOSPITAL_COMMUNITY): Payer: Managed Care, Other (non HMO)

## 2014-08-30 ENCOUNTER — Encounter (HOSPITAL_COMMUNITY): Payer: Self-pay | Admitting: *Deleted

## 2014-08-30 ENCOUNTER — Inpatient Hospital Stay (HOSPITAL_COMMUNITY)
Admission: AD | Admit: 2014-08-30 | Discharge: 2014-08-31 | Disposition: A | Discharge status: Home / Self Care | Payer: Managed Care, Other (non HMO) | Source: Ambulatory Visit | Attending: Obstetrics & Gynecology | Admitting: Obstetrics & Gynecology

## 2014-08-30 DIAGNOSIS — F419 Anxiety disorder, unspecified: Secondary | ICD-10-CM | POA: Diagnosis not present

## 2014-08-30 DIAGNOSIS — Z9889 Other specified postprocedural states: Secondary | ICD-10-CM

## 2014-08-30 DIAGNOSIS — R030 Elevated blood-pressure reading, without diagnosis of hypertension: Secondary | ICD-10-CM | POA: Insufficient documentation

## 2014-08-30 LAB — COMPREHENSIVE METABOLIC PANEL
ALT: 19 U/L (ref 0–35)
AST: 23 U/L (ref 0–37)
Albumin: 3.6 g/dL (ref 3.5–5.2)
Alkaline Phosphatase: 102 U/L (ref 39–117)
Anion gap: 11 (ref 5–15)
BILIRUBIN TOTAL: 0.2 mg/dL — AB (ref 0.3–1.2)
BUN: 15 mg/dL (ref 6–23)
CHLORIDE: 104 meq/L (ref 96–112)
CO2: 25 meq/L (ref 19–32)
CREATININE: 0.82 mg/dL (ref 0.50–1.10)
Calcium: 9.1 mg/dL (ref 8.4–10.5)
GLUCOSE: 108 mg/dL — AB (ref 70–99)
Potassium: 4.1 mEq/L (ref 3.7–5.3)
Sodium: 140 mEq/L (ref 137–147)
Total Protein: 7.1 g/dL (ref 6.0–8.3)

## 2014-08-30 LAB — CBC WITH DIFFERENTIAL/PLATELET
BASOS ABS: 0 10*3/uL (ref 0.0–0.1)
Basophils Relative: 1 % (ref 0–1)
EOS PCT: 3 % (ref 0–5)
Eosinophils Absolute: 0.3 10*3/uL (ref 0.0–0.7)
HEMATOCRIT: 29.6 % — AB (ref 36.0–46.0)
Hemoglobin: 9.9 g/dL — ABNORMAL LOW (ref 12.0–15.0)
LYMPHS PCT: 17 % (ref 12–46)
Lymphs Abs: 1.4 10*3/uL (ref 0.7–4.0)
MCH: 28.7 pg (ref 26.0–34.0)
MCHC: 33.4 g/dL (ref 30.0–36.0)
MCV: 85.8 fL (ref 78.0–100.0)
Monocytes Absolute: 1 10*3/uL (ref 0.1–1.0)
Monocytes Relative: 12 % (ref 3–12)
NEUTROS ABS: 5.8 10*3/uL (ref 1.7–7.7)
Neutrophils Relative %: 67 % (ref 43–77)
Platelets: 242 10*3/uL (ref 150–400)
RBC: 3.45 MIL/uL — AB (ref 3.87–5.11)
RDW: 13.2 % (ref 11.5–15.5)
WBC: 8.6 10*3/uL (ref 4.0–10.5)

## 2014-08-30 LAB — URIC ACID: Uric Acid, Serum: 5.9 mg/dL (ref 2.4–7.0)

## 2014-08-30 LAB — LACTATE DEHYDROGENASE: LDH: 158 U/L (ref 94–250)

## 2014-08-30 NOTE — MAU Provider Note (Signed)
History    Stephanie Wagner is a 33 y.o. [id number] who presents for vaginal bleeding and clots.  Patient is 4 weeks postpartum after an uncomplicated vaginal delivery with EBL of 300mL and 2nd degree laceration. S/P delivery patient had incident of syncope and bleeding and was started on PO methergine for 24 hours.  Currently patient reports that she noticed an increase in bleeding yesterday and passed a few clots prior to bedtime.  Patient states that today bleeding was normal.  However, around 2100 patient passed large clot that saturated pad and pants.  Patient reports bleeding remained small to scant, but cramping was present.  Patient called provider and was instructed to observe and report to MAU for increased bleeding or passing of additional clots.  Patient presents, to MAU, stating that she passed additional golf ball size clots prior to and upon arrival.    Patient Active Problem List   Diagnosis Date Noted  . Breast engorgement, postpartum 08/04/2014  . Second-degree perineal laceration, with delivery 08/04/2014  . Allergy to penicillin 08/02/2014  . Hx of migraines 08/02/2014  . Vaginal delivery 08/02/2014    Chief Complaint  Patient presents with  . Vaginal Bleeding   HPI  OB History    Gravida Para Term Preterm AB TAB SAB Ectopic Multiple Living   2 2 2       2       Past Medical History  Diagnosis Date  . Tachycardia   . Migraine   . Back pain   . Neck pain   . Chronic headaches   . Seasonal allergies   . Environmental allergies     animals, dander    Past Surgical History  Procedure Laterality Date  . Wisdom tooth extraction      Family History  Problem Relation Age of Onset  . Breast cancer Maternal Grandmother   . Diabetes Father   . Kidney disease Father     transplant  .     Marland Kitchen. Heart disease Maternal Grandfather   . Cancer Maternal Grandfather     testicular  . Thyroid disease Mother     History  Substance Use Topics  . Smoking status: Never  Smoker   . Smokeless tobacco: Never Used  . Alcohol Use: No    Allergies:  Allergies  Allergen Reactions  . Penicillins Nausea And Vomiting  . Claritin [Loratadine] Palpitations    Just during pregnancy    Prescriptions prior to admission  Medication Sig Dispense Refill Last Dose  . ferrous sulfate 325 (65 FE) MG tablet Take 325 mg by mouth daily with breakfast.   Past Week at Unknown time  . ibuprofen (ADVIL,MOTRIN) 600 MG tablet Take 1 tablet (600 mg total) by mouth every 6 (six) hours. 30 tablet 0   . Liniments (SALONPAS PAIN RELIEF PATCH) PADS Apply 1 each topically as needed (for pain).   Past Month at Unknown time  . Prenatal Vit-Fe Fumarate-FA (PRENATAL MULTIVITAMIN) TABS tablet Take 1 tablet by mouth daily at 12 noon.   Past Week at Unknown time    Review of Systems  Constitutional: Negative for fever and chills.  Eyes: Positive for photophobia. Negative for blurred vision and double vision.  Respiratory: Negative.   Cardiovascular: Negative.   Gastrointestinal: Positive for abdominal pain and diarrhea.  Genitourinary: Negative.   Musculoskeletal: Negative.   Skin: Negative.   Neurological: Positive for headaches.  Endo/Heme/Allergies: Negative.   Psychiatric/Behavioral: Negative.     See HPI Above Physical Exam  Blood pressure 148/100, pulse 110, resp. rate 18, SpO2 100 %, unknown if currently breastfeeding.  Results for orders placed or performed during the hospital encounter of 08/30/14 (from the past 24 hour(s))  CBC with Differential     Status: Abnormal   Collection Time: 08/30/14 11:16 PM  Result Value Ref Range   WBC 8.6 4.0 - 10.5 K/uL   RBC 3.45 (L) 3.87 - 5.11 MIL/uL   Hemoglobin 9.9 (L) 12.0 - 15.0 g/dL   HCT 16.1 (L) 09.6 - 04.5 %   MCV 85.8 78.0 - 100.0 fL   MCH 28.7 26.0 - 34.0 pg   MCHC 33.4 30.0 - 36.0 g/dL   RDW 40.9 81.1 - 91.4 %   Platelets 242 150 - 400 K/uL   Neutrophils Relative % 67 43 - 77 %   Neutro Abs 5.8 1.7 - 7.7 K/uL    Lymphocytes Relative 17 12 - 46 %   Lymphs Abs 1.4 0.7 - 4.0 K/uL   Monocytes Relative 12 3 - 12 %   Monocytes Absolute 1.0 0.1 - 1.0 K/uL   Eosinophils Relative 3 0 - 5 %   Eosinophils Absolute 0.3 0.0 - 0.7 K/uL   Basophils Relative 1 0 - 1 %   Basophils Absolute 0.0 0.0 - 0.1 K/uL  Comprehensive metabolic panel     Status: Abnormal   Collection Time: 08/30/14 11:16 PM  Result Value Ref Range   Sodium 140 137 - 147 mEq/L   Potassium 4.1 3.7 - 5.3 mEq/L   Chloride 104 96 - 112 mEq/L   CO2 25 19 - 32 mEq/L   Glucose, Bld 108 (H) 70 - 99 mg/dL   BUN 15 6 - 23 mg/dL   Creatinine, Ser 7.82 0.50 - 1.10 mg/dL   Calcium 9.1 8.4 - 95.6 mg/dL   Total Protein 7.1 6.0 - 8.3 g/dL   Albumin 3.6 3.5 - 5.2 g/dL   AST 23 0 - 37 U/L   ALT 19 0 - 35 U/L   Alkaline Phosphatase 102 39 - 117 U/L   Total Bilirubin 0.2 (L) 0.3 - 1.2 mg/dL   GFR calc non Af Amer >90 >90 mL/min   GFR calc Af Amer >90 >90 mL/min   Anion gap 11 5 - 15  Lactate dehydrogenase     Status: None   Collection Time: 08/30/14 11:16 PM  Result Value Ref Range   LDH 158 94 - 250 U/L  Uric acid     Status: None   Collection Time: 08/30/14 11:16 PM  Result Value Ref Range   Uric Acid, Serum 5.9 2.4 - 7.0 mg/dL    Physical Exam  Constitutional: She is oriented to person, place, and time. She appears well-developed and well-nourished.  Cardiovascular: Normal rate, regular rhythm and normal heart sounds.   Respiratory: Effort normal and breath sounds normal.  GI: Soft. Bowel sounds are normal. There is tenderness in the suprapubic area. There is no CVA tenderness.  Genitourinary: Uterus is enlarged and tender. There is bleeding in the vagina.  Sterile Speculum Exam: -Vaginal Vault: Bright Red Blood noted -Cervix: Os appears open, able to remove small clots Bimanual Exam: Deferred d/t pt discomfort Fundus palpated 1FB above symphyis pubis   Neurological: She is alert and oriented to person, place, and time.  Skin: Skin is  warm and dry.  Psychiatric: Her mood appears anxious.   FINDINGS: Uterus Measurements: 5.5 x 7.2 x 11.3 cm. No fibroids or other mass visualized.  Endometrium Thickness: 32.6  mm. Moderate heterogeneous thickening of the endometrium with mild increased vascularity suggesting retained products of conception.  Right ovary Measurements: 1.4 x 1.4 x 2.8 cm. Normal appearance/no adnexal mass.  Left ovary Measurements: 1.5 x 1.9 x 2.5 cm. Normal appearance/no adnexal mass.  Other findings: No free fluid  IMPRESSION: Moderately thickened endometrium measuring 32.6 mm with heterogeneous material demonstrating internal vascularity. Findings are likely due to retained products of conception. ED Course  Assessment: 33y.o. [id number] Vaginal Bleeding Suspected Retained POC Elevated BP Anxiety  Plan: -PE as charted -Labs: CBC with differential, CMP, UA, LDH -US for evaluation of uterus -Dr. Katharine LookJ. Ozan informed of patient presence and status   Follow Up (0015) -Preliminary Report suspects RPOC -In room to discuss findings and treatment with patient -Reports some anxiety regarding general anesthesia, reassurances given -Nurse instructed to start IV in [facility]  Follow Up ([id number]) -Official US reviewed -Dr. Katharine LookJ. Ozan contacted and results reviewed, will come in to discuss procedure -H&P Completed -No new orders   Sasan Wilkie, Karolyne LYNN CNM, MSN 08/30/2014 10:59 PM

## 2014-08-30 NOTE — MAU Note (Signed)
Pt presents with complaint of heavy vaginal bleeding. S/p vaginal delivery on 10/15, bleeding became heavier over the last 3 days. Cramping.

## 2014-08-31 ENCOUNTER — Encounter (HOSPITAL_COMMUNITY)
Admission: AD | Disposition: A | Discharge status: Home / Self Care | Payer: Self-pay | Source: Ambulatory Visit | Attending: Obstetrics & Gynecology

## 2014-08-31 ENCOUNTER — Inpatient Hospital Stay (HOSPITAL_COMMUNITY): Payer: Managed Care, Other (non HMO) | Admitting: Anesthesiology

## 2014-08-31 ENCOUNTER — Inpatient Hospital Stay (HOSPITAL_COMMUNITY): Payer: Managed Care, Other (non HMO)

## 2014-08-31 ENCOUNTER — Encounter (HOSPITAL_COMMUNITY): Payer: Self-pay | Admitting: Anesthesiology

## 2014-08-31 DIAGNOSIS — Z9889 Other specified postprocedural states: Secondary | ICD-10-CM

## 2014-08-31 HISTORY — PX: DILATION AND CURETTAGE OF UTERUS: SHX78

## 2014-08-31 SURGERY — DILATION AND CURETTAGE
Anesthesia: Monitor Anesthesia Care

## 2014-08-31 MED ORDER — ONDANSETRON HCL 4 MG/2ML IJ SOLN
INTRAMUSCULAR | Status: DC | PRN
  Administered 2014-08-31: 4 mg via INTRAVENOUS

## 2014-08-31 MED ORDER — LIDOCAINE HCL (CARDIAC) 20 MG/ML IV SOLN
INTRAVENOUS | Status: DC | PRN
  Administered 2014-08-31: 20 mg via INTRAVENOUS

## 2014-08-31 MED ORDER — METOCLOPRAMIDE HCL 5 MG/ML IJ SOLN
INTRAMUSCULAR | Status: AC
  Filled 2014-08-31: qty 2

## 2014-08-31 MED ORDER — FENTANYL CITRATE 0.05 MG/ML IJ SOLN
INTRAMUSCULAR | Status: DC | PRN
  Administered 2014-08-31 (×3): 50 ug via INTRAVENOUS

## 2014-08-31 MED ORDER — PROPOFOL 10 MG/ML IV EMUL
INTRAVENOUS | Status: DC | PRN
  Administered 2014-08-31 (×3): 20 mg via INTRAVENOUS

## 2014-08-31 MED ORDER — LACTATED RINGERS IV SOLN
INTRAVENOUS | Status: DC | PRN
  Administered 2014-08-31 (×3): via INTRAVENOUS

## 2014-08-31 MED ORDER — DEXAMETHASONE SODIUM PHOSPHATE 4 MG/ML IJ SOLN
INTRAMUSCULAR | Status: DC | PRN
  Administered 2014-08-31: 4 mg via INTRAVENOUS

## 2014-08-31 MED ORDER — PROPOFOL INFUSION 10 MG/ML OPTIME
INTRAVENOUS | Status: DC | PRN
  Administered 2014-08-31: 100 ug/kg/min via INTRAVENOUS

## 2014-08-31 MED ORDER — DEXTROSE 5 % IV SOLN
2.0000 g | INTRAVENOUS | Status: AC
  Administered 2014-08-31: 2 g via INTRAVENOUS
  Filled 2014-08-31: qty 2

## 2014-08-31 MED ORDER — MIDAZOLAM HCL 2 MG/2ML IJ SOLN
INTRAMUSCULAR | Status: DC | PRN
  Administered 2014-08-31: 2 mg via INTRAVENOUS

## 2014-08-31 MED ORDER — FENTANYL CITRATE 0.05 MG/ML IJ SOLN: 25.0000 ug | INTRAMUSCULAR | Status: DC | PRN

## 2014-08-31 MED ORDER — LIDOCAINE-EPINEPHRINE 1 %-1:100000 IJ SOLN
INTRAMUSCULAR | Status: DC | PRN
  Administered 2014-08-31: 15 mL

## 2014-08-31 MED ORDER — MEPERIDINE HCL 25 MG/ML IJ SOLN: 6.2500 mg | INTRAMUSCULAR | Status: DC | PRN

## 2014-08-31 MED ORDER — CITRIC ACID-SODIUM CITRATE 334-500 MG/5ML PO SOLN
30.0000 mL | Freq: Once | ORAL | Status: AC
  Administered 2014-08-31: 30 mL via ORAL
  Filled 2014-08-31: qty 15

## 2014-08-31 MED ORDER — LACTATED RINGERS IV SOLN: INTRAVENOUS | Status: DC

## 2014-08-31 MED ORDER — METOCLOPRAMIDE HCL 5 MG/ML IJ SOLN
10.0000 mg | Freq: Once | INTRAMUSCULAR | Status: AC | PRN
  Administered 2014-08-31: 10 mg via INTRAVENOUS

## 2014-08-31 SURGICAL SUPPLY — 18 items
CATH ROBINSON RED A/P 16FR (CATHETERS) ×3 IMPLANT
CLOTH BEACON ORANGE TIMEOUT ST (SAFETY) ×3 IMPLANT
DECANTER SPIKE VIAL GLASS SM (MISCELLANEOUS) ×3 IMPLANT
GLOVE BIO SURGEON STRL SZ 6.5 (GLOVE) ×6 IMPLANT
GLOVE BIOGEL PI IND STRL 6.5 (GLOVE) ×2 IMPLANT
GLOVE BIOGEL PI INDICATOR 6.5 (GLOVE) ×1
GOWN STRL REUS W/TWL LRG LVL3 (GOWN DISPOSABLE) ×6 IMPLANT
KIT BERKELEY 1ST TRIMESTER 3/8 (MISCELLANEOUS) IMPLANT
NS IRRIG 1000ML POUR BTL (IV SOLUTION) ×3 IMPLANT
PACK VAGINAL MINOR WOMEN LF (CUSTOM PROCEDURE TRAY) ×3 IMPLANT
PAD OB MATERNITY 4.3X12.25 (PERSONAL CARE ITEMS) ×3 IMPLANT
PAD PREP 24X48 CUFFED NSTRL (MISCELLANEOUS) ×3 IMPLANT
SET BERKELEY SUCTION TUBING (SUCTIONS) ×3 IMPLANT
TOWEL OR 17X24 6PK STRL BLUE (TOWEL DISPOSABLE) ×6 IMPLANT
VACURETTE 10 RIGID CVD (CANNULA) IMPLANT
VACURETTE 7MM CVD STRL WRAP (CANNULA) IMPLANT
VACURETTE 8 RIGID CVD (CANNULA) IMPLANT
VACURETTE 9 RIGID CVD (CANNULA) IMPLANT

## 2014-08-31 NOTE — Discharge Instructions (Signed)
HOME INSTRUCTIONS  Please note any unusual or excessive bleeding, pain, swelling. Mild dizziness or drowsiness are normal for about 24 hours after surgery.   Shower when comfortable  Restrictions: No driving for 24 hours or while taking pain medications.  Activity:  No heavy lifting (> 10 lbs), nothing in vagina (no tampons, douching, or intercourse) x 2 weeks; no tub baths for 2 weeks Vaginal spotting is expected but if your bleeding is heavy, period like,  please call the office    Diet:  You may eat whatever you want.  Do not eat large meals.  Eat small frequent meals throughout the day.  Continue to drink a good amount of water at least 6-8 glasses of water per day, hydration is very important for the healing process.  Pain Management: Take Motrin or Tylenol as needed for pain.  Always take prescription pain medication with food, it may cause constipation, increase fluids and fiber and you may want to take an over-the-counter stool softener like Colace as needed up to 2x a day.    Alcohol -- Avoid for 24 hours and while taking pain medications.  Nausea: Take sips of ginger ale or soda  Fever -- Call physician if temperature over 101 degrees  Follow up:  If you do not already have a follow up appointment scheduled, please call the office and schedule a follow up in 2 weeks.  If you experience fever (a temperature greater than 100.4), pain unrelieved by pain medication, shortness of breath, swelling of a single leg, or any other symptoms which are concerning to you please the office immediately.

## 2014-08-31 NOTE — Anesthesia Postprocedure Evaluation (Signed)
  Anesthesia Post-op Note  Patient: Unk LightningJessica Busey  Procedure(s) Performed: Procedure(s): Dilatation and Currettage with Ultrasound Guidance  Patient Location: PACU  Anesthesia Type:MAC  Level of Consciousness: awake, alert  and oriented  Airway and Oxygen Therapy: Patient Spontanous Breathing  Post-op Pain: none  Post-op Assessment: Post-op Vital signs reviewed, Patient's Cardiovascular Status Stable, Respiratory Function Stable, Patent Airway, No signs of Nausea or vomiting and Pain level controlled  Post-op Vital Signs: Reviewed and stable  Last Vitals:  Filed Vitals:   08/31/14 0345  BP:   Pulse: 81  Temp:   Resp: 15    Complications: No apparent anesthesia complications

## 2014-08-31 NOTE — Progress Notes (Signed)
Stephanie Wagner  Postpartum Day  LOS: 1  Subjective Patient resting in bed. Easily awakened with light touch. Reports cramping and nausea. Husband remains at bedside.   Objective  Filed Vitals:   08/31/14 0300 08/31/14 0315 08/31/14 0330 08/31/14 0345  BP: 101/72 108/79 111/81   Pulse: 81 87 81 81  Temp:      TempSrc:      Resp: 15 15 14 15   SpO2: 100% 99% 99% 99%    HEMOGLOBIN  Date/Time Value Ref Range Status  08/30/2014 11:16 PM 9.9* 12.0 - 15.0 g/dL Final  82/95/621310/16/2015 08:6505:35 AM 8.8* 12.0 - 15.0 g/dL Final   HCT  Date/Time Value Ref Range Status  08/30/2014 11:16 PM 29.6* 36.0 - 46.0 % Final  08/03/2014 05:35 AM 25.6* 36.0 - 46.0 % Final   PLATELETS  Date/Time Value Ref Range Status  08/30/2014 11:16 PM 242 150 - 400 K/uL Final  08/03/2014 05:35 AM 212 150 - 400 K/uL Final    PE: Lochia: Dark Red, Small to Scant    Assessment Postpartum Wk 4 S/P Dilation and Curettage  Plan Will continue to monitor Plan for 7am discharge Okay for routine floor orders Reglan for nausea Contact provider for any issues and/or concerns--phone number left with nurse  Haroldine Redler, Tawn LYNN, CNM 08/31/2014, 3:50 AM

## 2014-08-31 NOTE — H&P (Signed)
[name] is an 33 y.o. female. [id number] who is 4 weeks postpartum.  Patient had uncomplicated vaginal delivery with EBL of 300mL and 2nd degree laceration. S/P delivery patient had incident of syncope and bleeding and was started on PO methergine for 24 hours.  Patient presents tonight with increased vaginal bleeding and passing large clots.  Patient also reports cramping.  No recent intercourse and patient is bottlefeeding infant.   Pertinent Gynecological History: Bleeding: Postpartum d/t retained products  Contraception: vasectomy DES exposure: Not assessed Blood transfusions: none Sexually transmitted diseases: no past history Previous GYN Procedures: None  Last pap: normal Date: 01/09/2014 OB History: G2, P2002   Menstrual History: LMP:  10/30/2013  No LMP recorded.    Past Medical History  Diagnosis Date  . Tachycardia   . Migraine   . Back pain   . Neck pain   . Chronic headaches   . Seasonal allergies   . Environmental allergies     animals, dander    Past Surgical History  Procedure Laterality Date  . Wisdom tooth extraction      Family History  Problem Relation Age of Onset  . Breast cancer Maternal Grandmother   . Diabetes Father   . Kidney disease Father     transplant  .     Marland Kitchen. Heart disease Maternal Grandfather   . Cancer Maternal Grandfather     testicular  . Thyroid disease Mother     Social History:  reports that she has never smoked. She has never used smokeless tobacco. She reports that she does not drink alcohol or use illicit drugs.  Allergies:  Allergies  Allergen Reactions  . Penicillins Nausea And Vomiting  . Claritin [Loratadine] Palpitations    Just during pregnancy    Prescriptions prior to admission  Medication Sig Dispense Refill Last Dose  . ibuprofen (ADVIL,MOTRIN) 600 MG tablet Take 1 tablet (600 mg total) by mouth every 6 (six) hours. 30 tablet 0 08/29/2014 at Unknown time  . Liniments (SALONPAS PAIN RELIEF PATCH) PADS  Apply 1 each topically as needed (for pain).   08/29/2014 at Unknown time  . Prenatal Vit-Fe Fumarate-FA (PRENATAL MULTIVITAMIN) TABS tablet Take 1 tablet by mouth daily at 12 noon.   08/30/2014 at Unknown time  . ferrous sulfate 325 (65 FE) MG tablet Take 325 mg by mouth daily with breakfast.   Past Week at Unknown time    Review of Systems  Constitutional: Negative for fever and chills.  Eyes: Negative for blurred vision and double vision.  Cardiovascular: Negative.   Gastrointestinal: Positive for abdominal pain and diarrhea. Negative for nausea, vomiting and constipation.  Genitourinary: Negative for dysuria and urgency.  Skin: Negative.   Neurological: Positive for headaches. Negative for dizziness.  Endo/Heme/Allergies: Negative.   Psychiatric/Behavioral: Negative.     Blood pressure 140/100, pulse 92, resp. rate 20, SpO2 100 %, unknown if currently breastfeeding. Physical Exam  Constitutional: She appears well-developed and well-nourished.  Cardiovascular: Normal rate, regular rhythm and normal heart sounds.   Respiratory: Effort normal and breath sounds normal.  GI: Soft. Bowel sounds are normal. There is tenderness in the suprapubic area.  Genitourinary: Uterus is enlarged and tender. There is bleeding in the vagina.  Sterile Speculum Exam: -Vaginal Vault: Bright Red Blood noted -Cervix: Os appears open, able to remove small clots  Bimanual Exam: Deferred d/t pt discomfort  Fundus palpated 1FB above symphyis pubis  Neurological: She is alert.  Skin: Skin is warm and dry.  Results for orders placed or performed during the hospital encounter of 08/30/14 (from the past 24 hour(s))  CBC with Differential     Status: Abnormal   Collection Time: 08/30/14 11:16 PM  Result Value Ref Range   WBC 8.6 4.0 - 10.5 K/uL   RBC 3.45 (L) 3.87 - 5.11 MIL/uL   Hemoglobin 9.9 (L) 12.0 - 15.0 g/dL   HCT 16.129.6 (L) 09.636.0 - 04.546.0 %   MCV 85.8 78.0 - 100.0 fL   MCH 28.7 26.0 - 34.0 pg    MCHC 33.4 30.0 - 36.0 g/dL   RDW 40.913.2 81.111.5 - 91.415.5 %   Platelets 242 150 - 400 K/uL   Neutrophils Relative % 67 43 - 77 %   Neutro Abs 5.8 1.7 - 7.7 K/uL   Lymphocytes Relative 17 12 - 46 %   Lymphs Abs 1.4 0.7 - 4.0 K/uL   Monocytes Relative 12 3 - 12 %   Monocytes Absolute 1.0 0.1 - 1.0 K/uL   Eosinophils Relative 3 0 - 5 %   Eosinophils Absolute 0.3 0.0 - 0.7 K/uL   Basophils Relative 1 0 - 1 %   Basophils Absolute 0.0 0.0 - 0.1 K/uL  Comprehensive metabolic panel     Status: Abnormal   Collection Time: 08/30/14 11:16 PM  Result Value Ref Range   Sodium 140 137 - 147 mEq/L   Potassium 4.1 3.7 - 5.3 mEq/L   Chloride 104 96 - 112 mEq/L   CO2 25 19 - 32 mEq/L   Glucose, Bld 108 (H) 70 - 99 mg/dL   BUN 15 6 - 23 mg/dL   Creatinine, Ser 7.820.82 0.50 - 1.10 mg/dL   Calcium 9.1 8.4 - 95.610.5 mg/dL   Total Protein 7.1 6.0 - 8.3 g/dL   Albumin 3.6 3.5 - 5.2 g/dL   AST 23 0 - 37 U/L   ALT 19 0 - 35 U/L   Alkaline Phosphatase 102 39 - 117 U/L   Total Bilirubin 0.2 (L) 0.3 - 1.2 mg/dL   GFR calc non Af Amer >90 >90 mL/min   GFR calc Af Amer >90 >90 mL/min   Anion gap 11 5 - 15  Lactate dehydrogenase     Status: None   Collection Time: 08/30/14 11:16 PM  Result Value Ref Range   LDH 158 94 - 250 U/L  Uric acid     Status: None   Collection Time: 08/30/14 11:16 PM  Result Value Ref Range   Uric Acid, Serum 5.9 2.4 - 7.0 mg/dL    No results found.  Assessment: 33y.o. [id number] 4wks Postpartum Increased PP Bleeding Secondary to Retained POC  Plan: Prepare patient for US guided DnC Dr. Katharine LookJ. Ozan to contact OR and place relevant orders   [facility] Hospital[facility], Kenley LYNN CNM, MSN 08/31/2014, 12:18 AM

## 2014-08-31 NOTE — Progress Notes (Signed)
Patient released from anesthesia care at 4:00, patient to remain in PACU until 7:00 am then to be examined by Dr Charlotta Newtonzan then released if stable. Vitals in PACU every 60 minutes

## 2014-08-31 NOTE — Transfer of Care (Signed)
Immediate Anesthesia Transfer of Care Note  Patient: [name]  Procedure(s) Performed: Procedure(s): Dilatation and Currettage with Ultrasound Guidance  Patient Location: PACU  Anesthesia Type:MAC  Level of Consciousness: awake, alert , oriented and patient cooperative  Airway & Oxygen Therapy: Patient Spontanous Breathing and Patient connected to nasal cannula oxygen  Post-op Assessment: Report given to PACU RN and Post -op Vital signs reviewed and stable  Post vital signs: Reviewed and stable  Complications: No apparent anesthesia complications

## 2014-08-31 NOTE — Progress Notes (Signed)
Stephanie Wagner  Postpartum Wk 4 S/P Dilation and Curettage LOS: 1  Subjective Patient reports mild cramping.  Denies nausea.  Objective  Filed Vitals:   08/31/14 0345 08/31/14 0400 08/31/14 0500 08/31/14 0600  BP:  115/73 123/80 117/81  Pulse: 81 79 70   Temp:  98.8 F (37.1 C) 98.4 F (36.9 C)   TempSrc:      Resp: 15 15 14    SpO2: 99% 98% 98% 98%    HEMOGLOBIN  Date/Time Value Ref Range Status  08/30/2014 11:16 PM 9.9* 12.0 - 15.0 g/dL Final  16/10/960410/16/2015 54:0905:35 AM 8.8* 12.0 - 15.0 g/dL Final   HCT  Date/Time Value Ref Range Status  08/30/2014 11:16 PM 29.6* 36.0 - 46.0 % Final  08/03/2014 05:35 AM 25.6* 36.0 - 46.0 % Final   PLATELETS  Date/Time Value Ref Range Status  08/30/2014 11:16 PM 242 150 - 400 K/uL Final  08/03/2014 05:35 AM 212 150 - 400 K/uL Final    PE:  Lochia-Dark Red, Scant   Assessment Postpartum Wk 4 S/P Dilation and Curettage  Plan Discharge to home Bleeding Precautions Follow up in office in 1 week--Message left with [facility] triage Encouraged to call if any questions or concerns arise prior to next scheduled office visit.   Vic Esco, Stephanie Wagner, CNM 08/31/2014, 6:54 AM

## 2014-08-31 NOTE — Op Note (Signed)
Preoperative diagnosis: Retained products  Postoperative diagnosis: Same  Anesthesia: IV sedation and paracervical block  Anesthesiologist: Dr. Malen GauzeFoster  Procedure: Dilation and curettage under ultrasound guidance  Surgeon: Dr. Myna HidalgoJennifer Laikynn Pollio  Estimated blood loss: 50cc IVF: 1300cc UOP: 100cc  Procedure:  After being informed of the planned procedure with possible complications including bleeding, infection and injury to uterus, informed consent is obtained and patient is taken to OR. She is placed in the lithotomy position and given IV sedation without complication. She is then prepped and draped in a sterile  fashion and her bladder is emptied with an in and out red rubber catheter.  Pelvic exam reveals a anteverted  uterus compatible with [redacted] weeks gestation. Both adnexa are felt and normal.  A speculum is inserted in the vagina and the anterior lip of the cervix was grasped with a ring forcep. We proceed with a paracervical block using 15 cc of Lidocaine1% with epinephrine. The cervix was noted to be ~3cm dilated.  Under ultrasound guidance, sharp curettage was performed to remove the retained produced, including a large calcified retrained product ~ 4cm in size. Complete evacuation was achieved and verified with ultrasound guidance.  Instruments were removed and hemostasis was noted. Instrument and sponge count were correct. The patient was taken to recovery room in stable condition.  Specimen: Products of conception sent to pathology  Myna HidalgoJennifer Rhoda Waldvogel, DO [phone] (pager) [phone] (office)

## 2014-08-31 NOTE — Anesthesia Preprocedure Evaluation (Signed)
Anesthesia Evaluation  Patient identified by MRN, date of birth, ID band Patient awake    Reviewed: Allergy & Precautions, H&P , NPO status , Patient's Chart, lab work & pertinent test results  Airway Mallampati: II  TM Distance: >3 FB Neck ROM: Full    Dental no notable dental hx. (+) Teeth Intact   Pulmonary neg pulmonary ROS,  breath sounds clear to auscultation  Pulmonary exam normal       Cardiovascular negative cardio ROS  Rhythm:Regular Rate:Normal     Neuro/Psych  Headaches, negative psych ROS   GI/Hepatic negative GI ROS, Neg liver ROS,   Endo/Other  negative endocrine ROS  Renal/GU negative Renal ROS  negative genitourinary   Musculoskeletal Chronic neck and back pain   Abdominal   Peds  Hematology  (+) anemia ,   Anesthesia Other Findings   Reproductive/Obstetrics 25 days post partum with retained POC                             Anesthesia Physical Anesthesia Plan  ASA: II and emergent  Anesthesia Plan: MAC   Post-op Pain Management:    Induction: Intravenous  Airway Management Planned: Simple Face Mask  Additional Equipment:   Intra-op Plan:   Post-operative Plan:   Informed Consent: I have reviewed the patients History and Physical, chart, labs and discussed the procedure including the risks, benefits and alternatives for the proposed anesthesia with the patient or authorized representative who has indicated his/her understanding and acceptance.     Plan Discussed with: Anesthesiologist, CRNA and Surgeon  Anesthesia Plan Comments:         Anesthesia Quick Evaluation

## 2014-08-31 NOTE — Progress Notes (Signed)
At beside to discuss plan for Dilation & Curettage under ultrasound guidance due to retained products.  Risk, benefit and indications reviewed with patient including, but not limited to risk of bleeding, infection and uterine perforation.  Pt aware and wishes to proceed.  Anesthesia at bedside to review OR plan.  Stephanie HidalgoJennifer Chidubem Chaires, DO [phone] (pager) [phone] (office)

## 2014-09-03 ENCOUNTER — Encounter (HOSPITAL_COMMUNITY): Payer: Self-pay | Admitting: Obstetrics & Gynecology

## 2014-09-07 ENCOUNTER — Inpatient Hospital Stay (HOSPITAL_COMMUNITY): Payer: Managed Care, Other (non HMO)

## 2014-09-07 ENCOUNTER — Inpatient Hospital Stay (HOSPITAL_COMMUNITY)
Admission: AD | Admit: 2014-09-07 | Discharge: 2014-09-07 | Disposition: A | Discharge status: Home / Self Care | Payer: Managed Care, Other (non HMO) | Source: Ambulatory Visit | Attending: Obstetrics and Gynecology | Admitting: Obstetrics and Gynecology

## 2014-09-07 DIAGNOSIS — N939 Abnormal uterine and vaginal bleeding, unspecified: Secondary | ICD-10-CM | POA: Diagnosis present

## 2014-09-07 DIAGNOSIS — Z9889 Other specified postprocedural states: Secondary | ICD-10-CM

## 2014-09-07 NOTE — MAU Note (Signed)
Pt reports she had a D&C one week ago, tonight had an increase in vaginal bleeding and passing clots.

## 2014-09-10 NOTE — MAU Provider Note (Signed)
History   33 yo G2nowP2002 s/p uncomplicated vaginal delivery 5 wks ago. Postpartum course c/b abnormal uterine bleeding, s/p D&C on 08/31/14. Presents to MAU because she did not expect to have vaginal bleeding or passage of clots after D&C. Describes passage of 1 inch long clot that she brought in for provider to see. Denies lightheadness, dizziness or fainting. Does admit to vaginal bleeding that requires less than 1 pad per hour. Appetite good, no N/V, fevers or chills. Baby doing well.  Patient Active Problem List   Diagnosis Date Noted  . Postpartum bleeding 09/07/2014  . S/P D&C (status post dilation and curettage) 08/31/2014  . Breast engorgement, postpartum 08/04/2014  . Second-degree perineal laceration, with delivery 08/04/2014  . Allergy to penicillin 08/02/2014  . Hx of migraines 08/02/2014  . Vaginal delivery 08/02/2014    No chief complaint on file.  HPI See above OB History    Gravida Para Term Preterm AB TAB SAB Ectopic Multiple Living   2 2 2       2       Past Medical History  Diagnosis Date  . Tachycardia   . Migraine   . Back pain   . Neck pain   . Chronic headaches   . Seasonal allergies   . Environmental allergies     animals, dander    Past Surgical History  Procedure Laterality Date  . Wisdom tooth extraction    . Dilation and curettage of uterus  08/31/2014    Procedure: Dilatation and Currettage with Ultrasound Guidance;  Surgeon: Sharon SellerJennifer M Ozan, DO;  Location: [facility];  Service: Gynecology;;    Family History  Problem Relation Age of Onset  . Breast cancer Maternal Grandmother   . Diabetes Father   . Kidney disease Father     transplant  .     Marland Kitchen. Heart disease Maternal Grandfather   . Cancer Maternal Grandfather     testicular  . Thyroid disease Mother     History  Substance Use Topics  . Smoking status: Never Smoker   . Smokeless tobacco: Never Used  . Alcohol Use: No    Allergies:  Allergies  Allergen Reactions  .  Penicillins Nausea And Vomiting  . Claritin [Loratadine] Palpitations    Just during pregnancy    No prescriptions prior to admission    ROS  Per HPI Physical Exam   Blood pressure 114/84, pulse 82, temperature 98.6 F (37 C), temperature source Oral, resp. rate 18, height 5\' 8"  (1.727 m), weight 147 lb (66.679 kg), unknown if currently breastfeeding.  Physical Exam Gen: Anxious Lungs: CTAB CV: RRR Abdomen: soft, NTND Pelvic: Refused ED Course  Limited u/s  Assessment: Uterine bleeding s/p D&C. U/S not consistent with retained placenta  Plan: DC Home Reassurance Bleeding precautions Office f/u as previously scheduled  Sherre ScarletWILLIAMS, Treshaun Carrico CNM, MS 09/07/14, 03:00 AM

## 2015-01-24 ENCOUNTER — Ambulatory Visit: Payer: Managed Care, Other (non HMO) | Admitting: Podiatry

## 2015-08-23 IMAGING — US US PELVIS COMPLETE
1 series · 14 of 25 positions shown · non-contrast
Comparison: None.

CLINICAL DATA: Postpartum bleeding and cramping. Delivery
08/02/2014.

EXAM:
TRANSABDOMINAL ULTRASOUND OF PELVIS
TECHNIQUE: Transabdominal ultrasound examination of the pelvis was performed
including evaluation of the uterus, ovaries, adnexal regions, and
pelvic cul-de-sac. Patient deferred transvaginal scanning.

[Series 1: us pelvis complete · 14 of 39 slices shown]
[im 1/39]
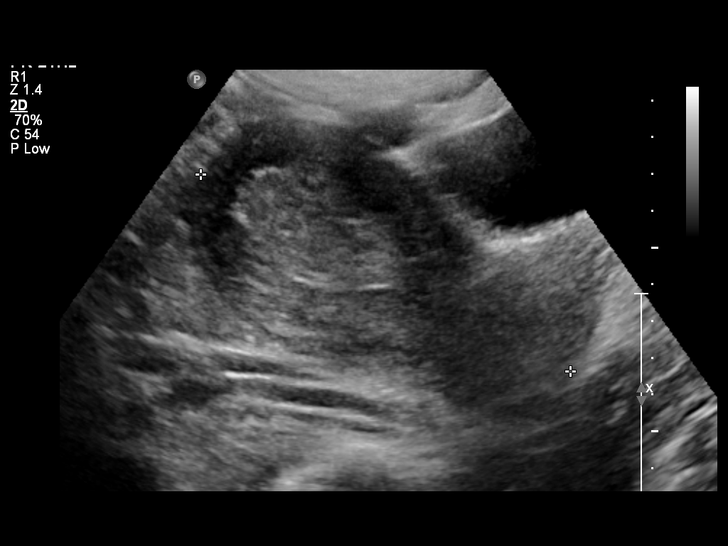
[im 4/39]
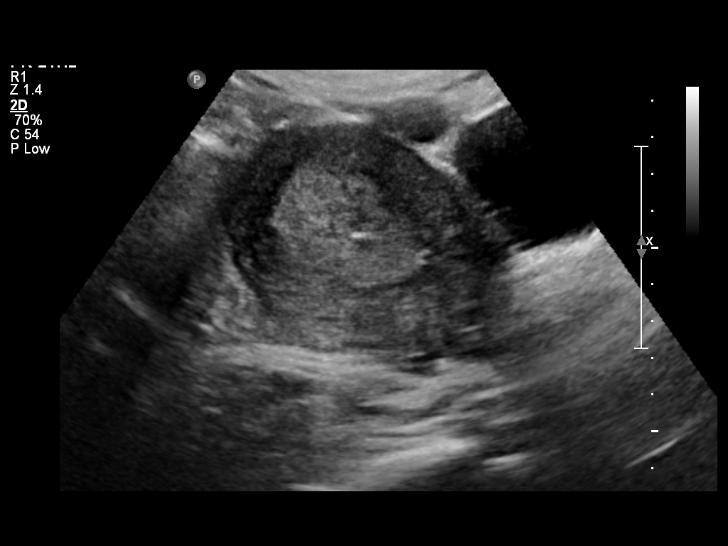
[im 7/39]
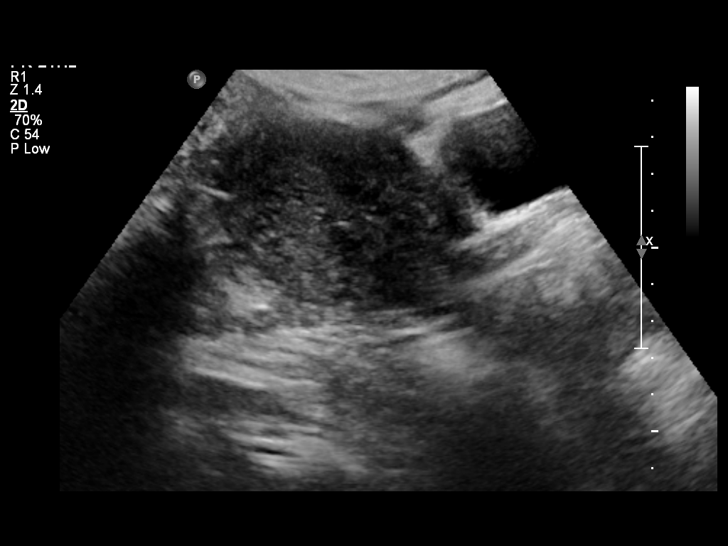
[im 10/39]
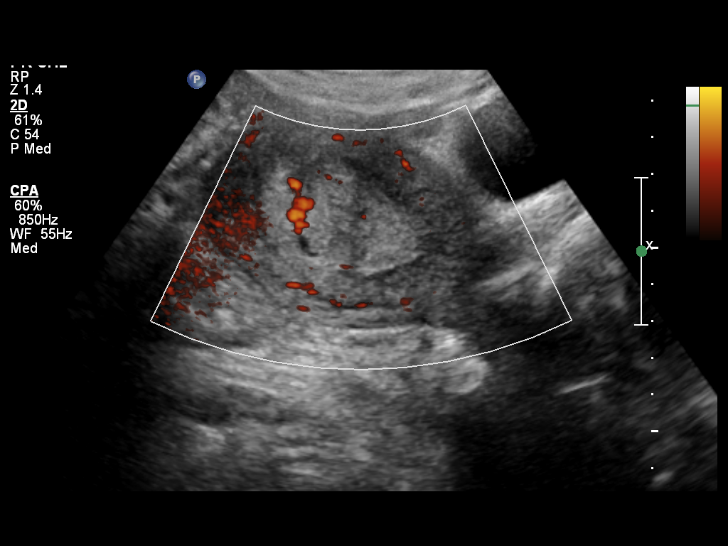
[im 13/39]
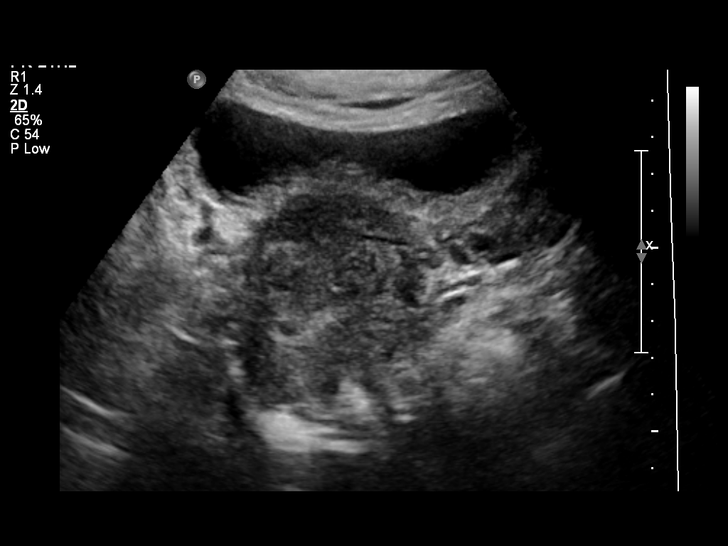
[im 15/39]
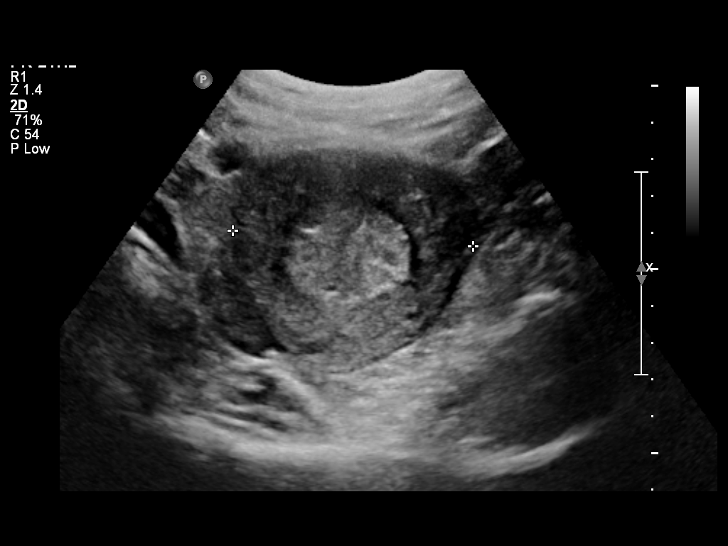
[im 18/39]
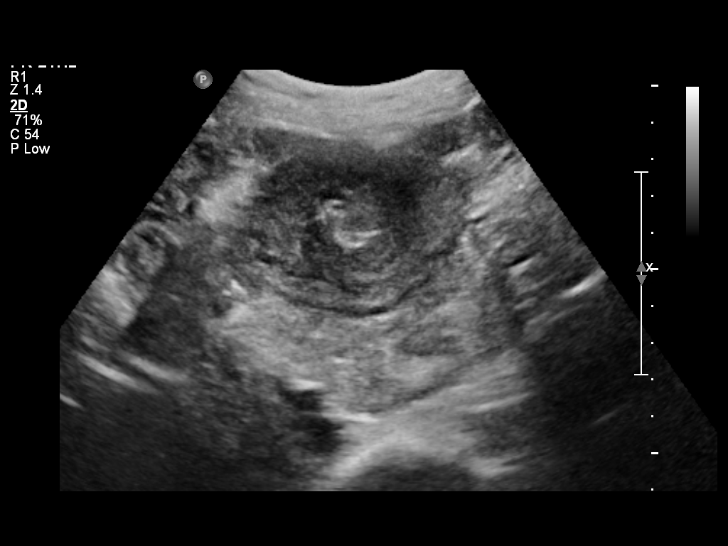
[im 21/39]
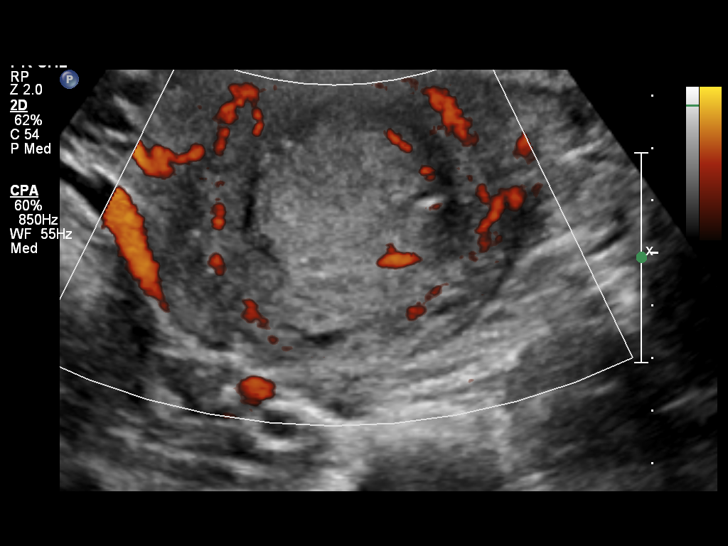
[im 24/39]
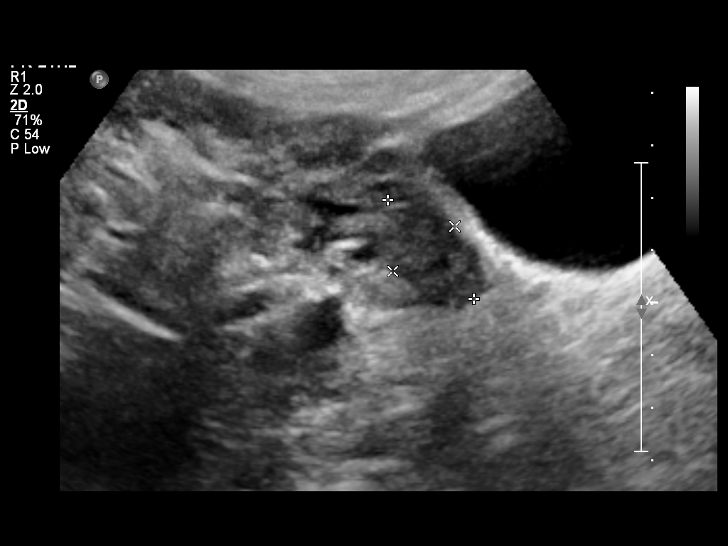
[im 26/39]
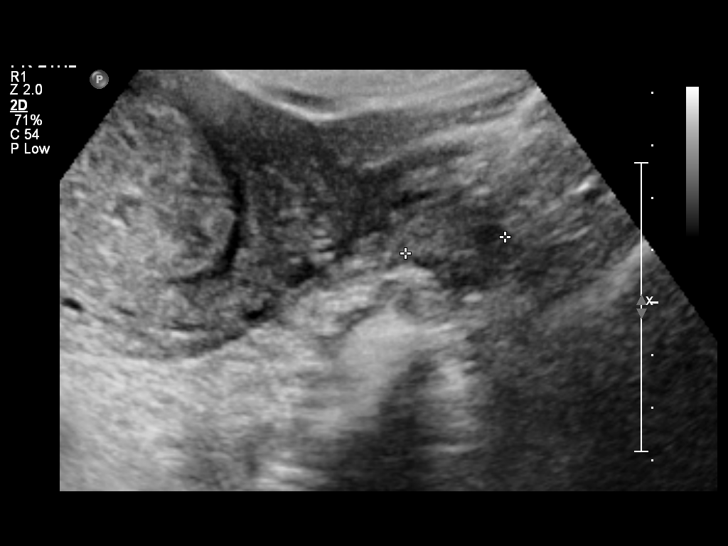
[im 29/39]
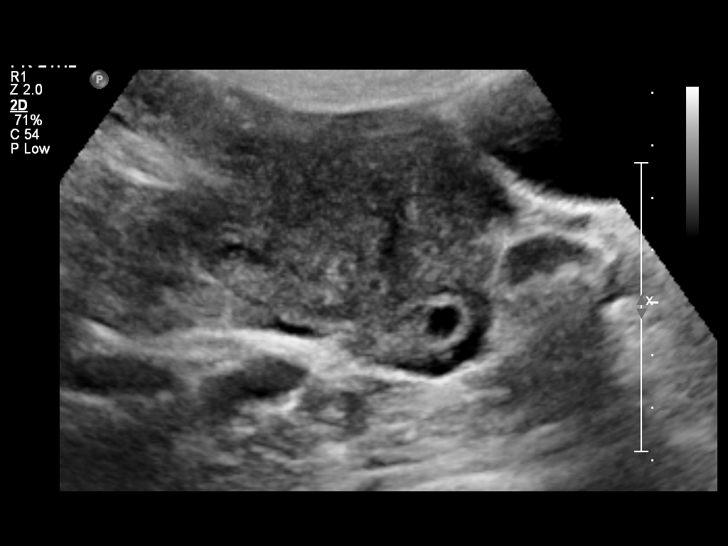
[im 32/39]
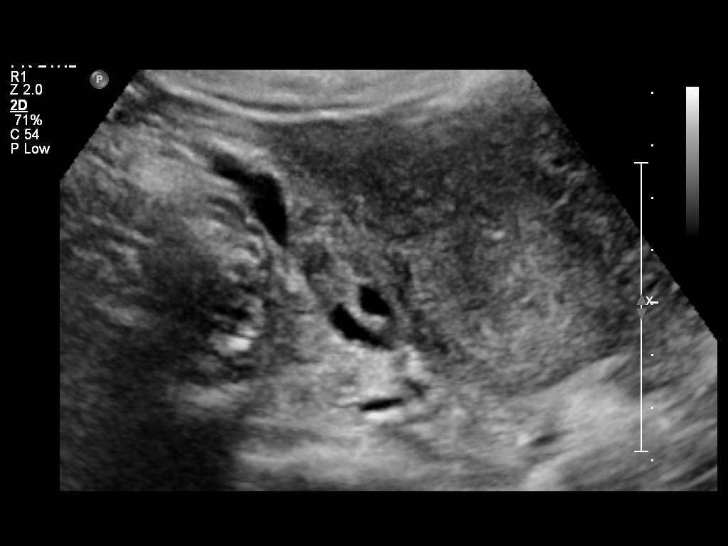
[im 35/39]
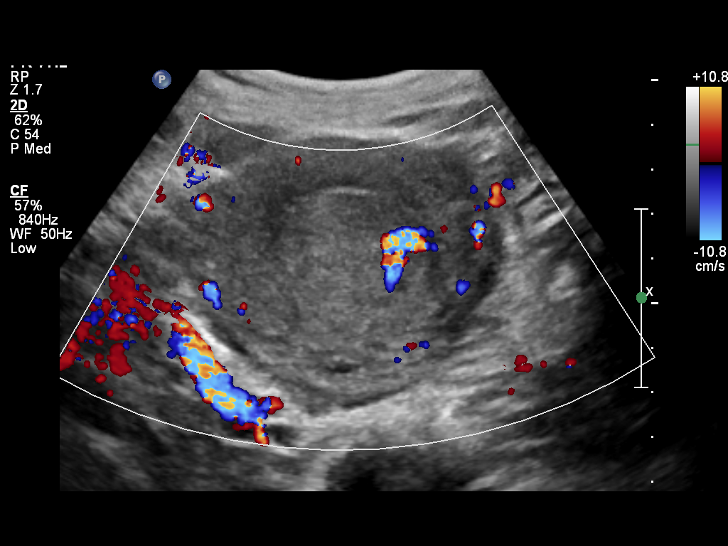
[im 39/39]
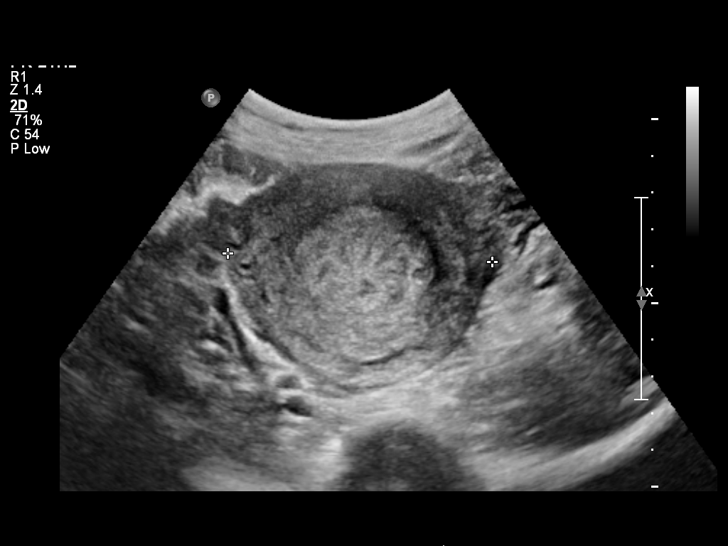

[14 of 25 positions shown; findings below may reference images not displayed]

FINDINGS: Uterus

Measurements: 5.5 x 7.2 x 11.3 cm. No fibroids or other mass
visualized.

Endometrium

Thickness: 32.6 mm. Moderate heterogeneous thickening of the
endometrium with mild increased vascularity suggesting retained
products of conception.

Right ovary

Measurements: 1.4 x 1.4 x 2.8 cm. Normal appearance/no adnexal mass.

Left ovary

Measurements: 1.5 x 1.9 x 2.5 cm. Normal appearance/no adnexal mass.

Other findings:  No free fluid
IMPRESSION: Moderately thickened endometrium measuring 32.6 mm with
heterogeneous material demonstrating internal vascularity. Findings
are likely due to retained products of conception.

These results were called by telephone at the time of interpretation
on 08/31/2014 at [DATE] to Leendert Osas(patient's nurse), who
verbally acknowledged these results.

## 2015-08-31 IMAGING — US US PELVIS COMPLETE
1 series · 13 of 25 positions shown · non-contrast
Comparison: 08/30/2014

CLINICAL DATA: Vaginal delivery 08/02/2014, D and C for retained
products 08/31/2014. Patient now passing large clots. Increased
vaginal bleeding. Transabdominal ultrasound only was obtained per
request of the referring physician due to the patient's pain.

EXAM:
TRANSABDOMINAL ULTRASOUND OF PELVIS
TECHNIQUE: Transabdominal ultrasound examination of the pelvis was performed
including evaluation of the uterus, ovaries, adnexal regions, and
pelvic cul-de-sac.

[Series 1: us pelvis complete · 13 of 44 slices shown]
[im 1/44]
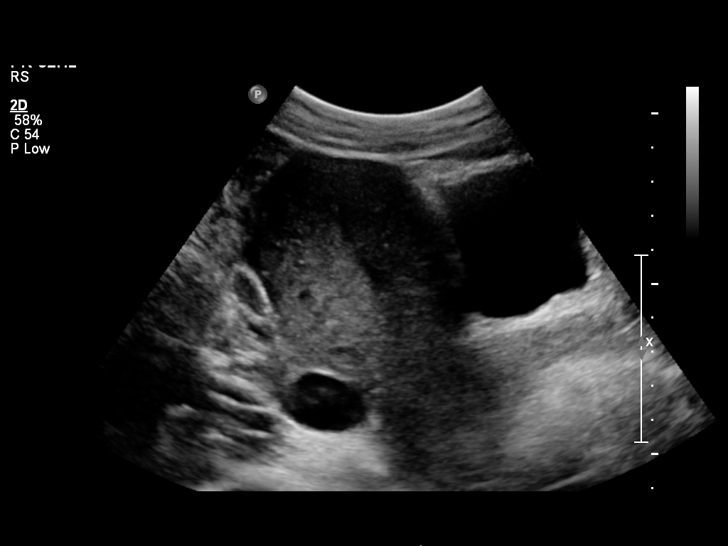
[im 4/44]
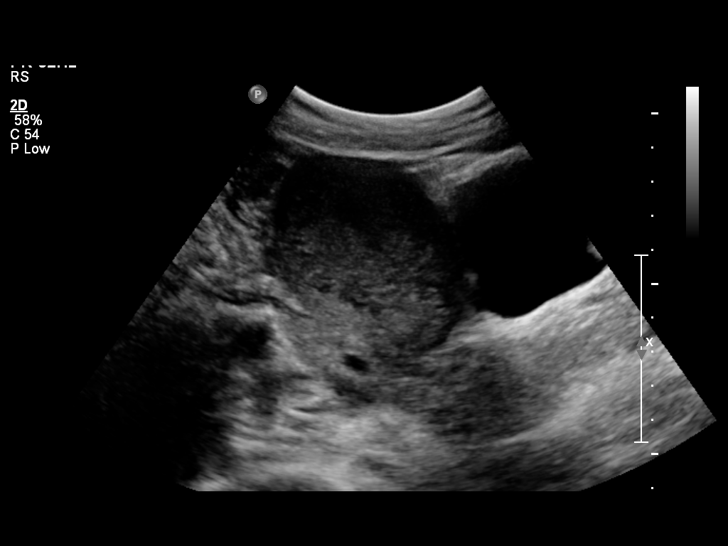
[im 8/44]
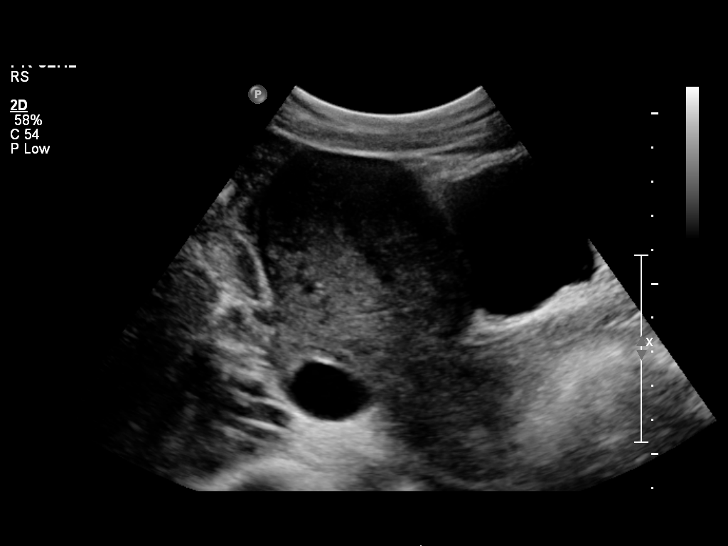
[im 11/44]
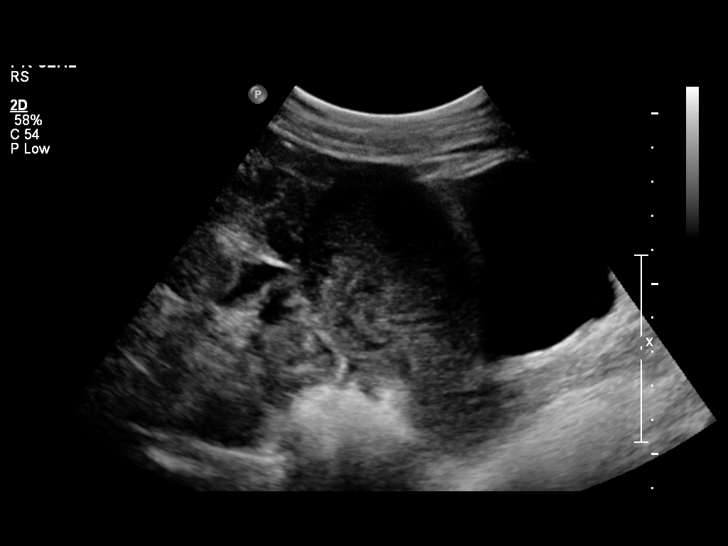
[im 15/44]
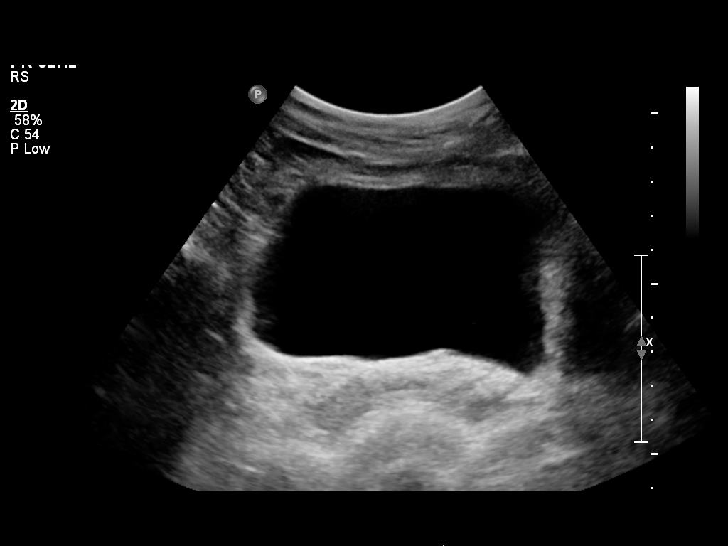
[im 18/44]
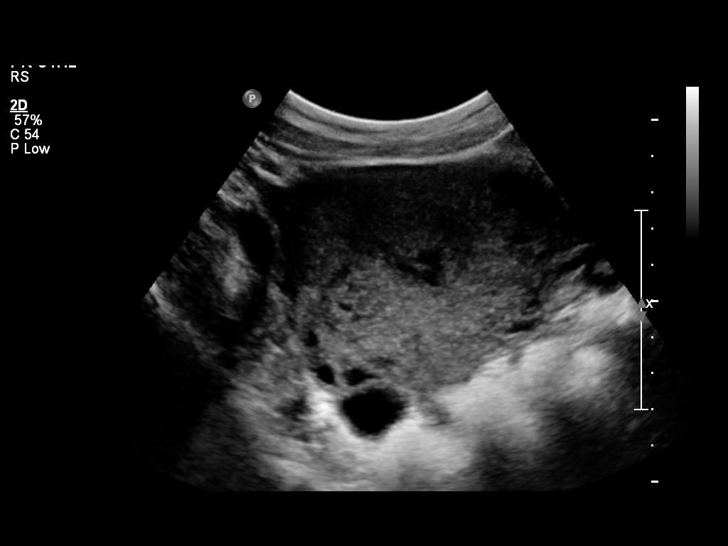
[im 22/44]
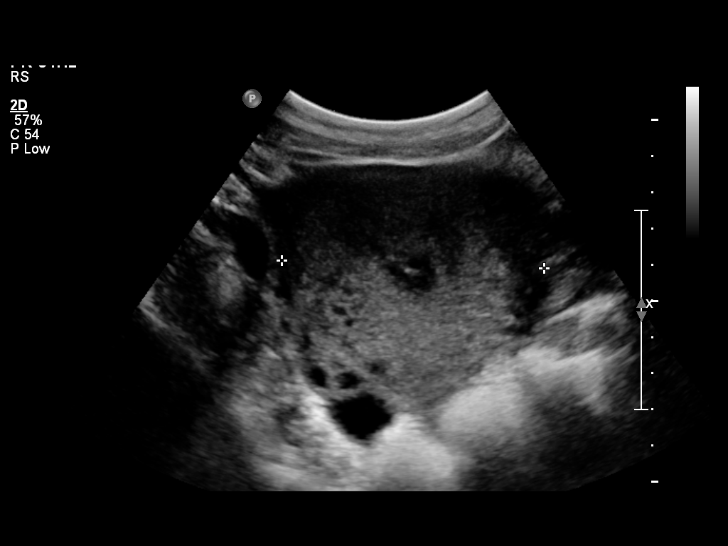
[im 26/44]
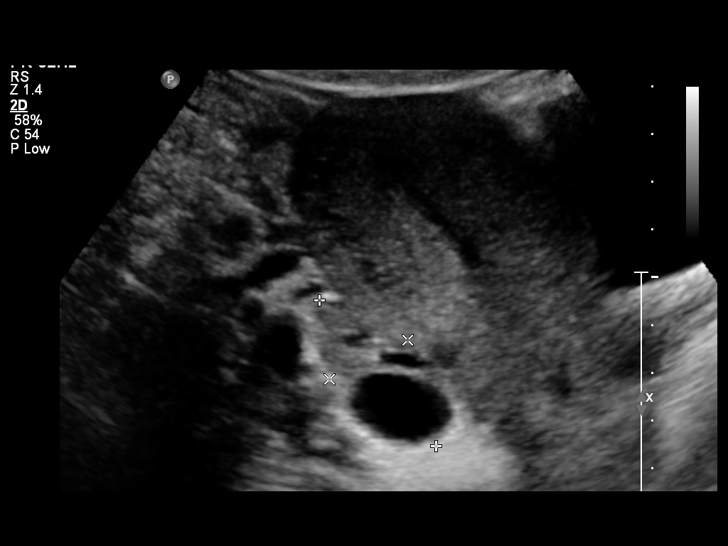
[im 29/44]
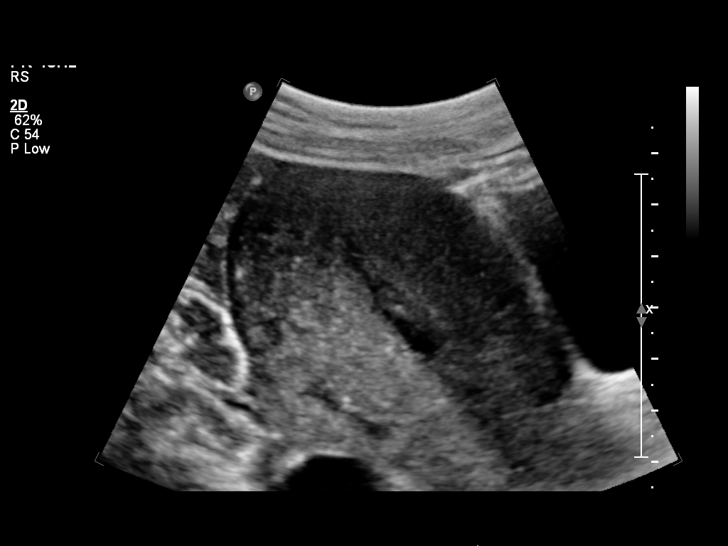
[im 33/44]
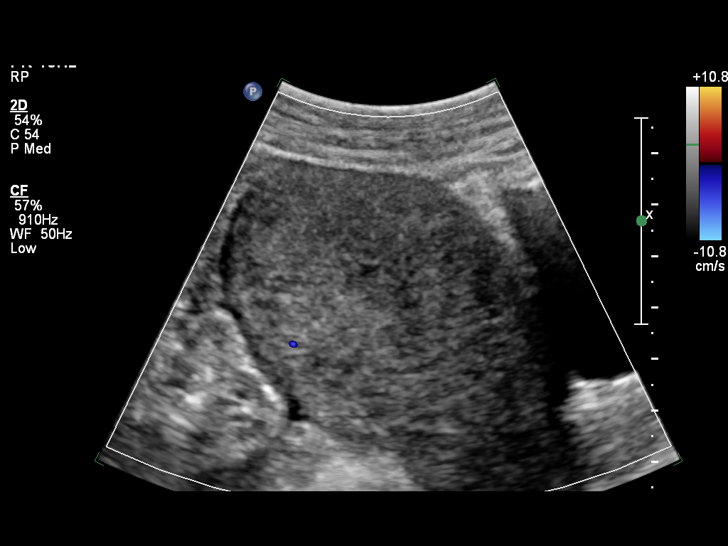
[im 36/44]
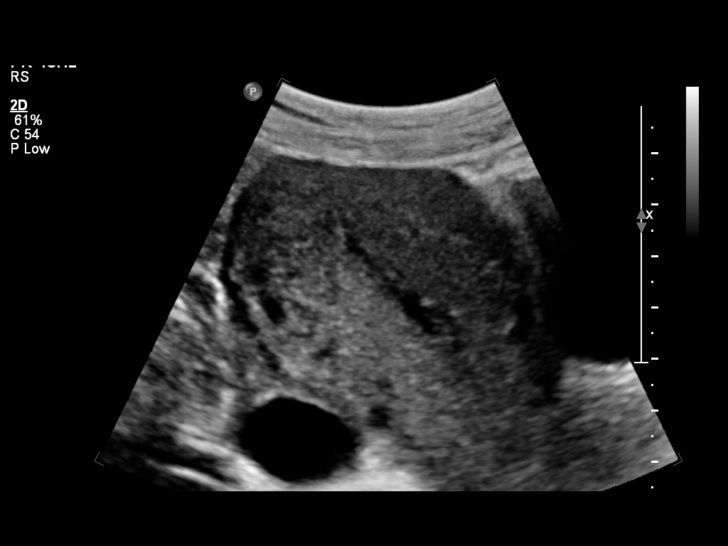
[im 40/44]
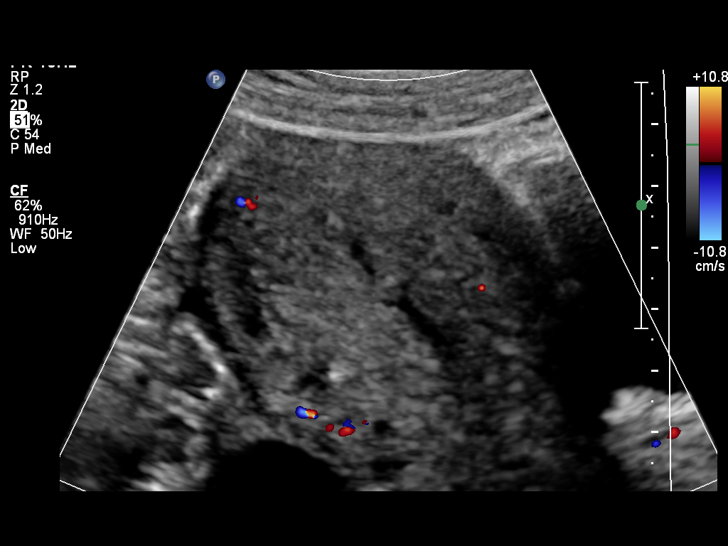
[im 44/44]
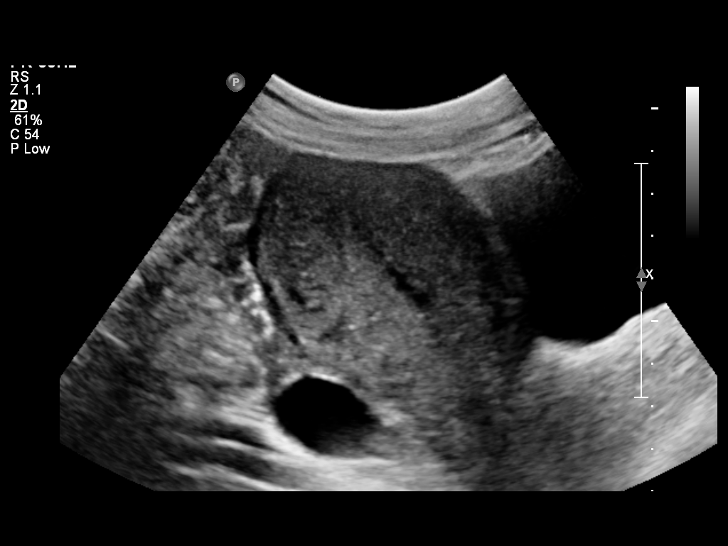

[13 of 25 positions shown; findings below may reference images not displayed]

FINDINGS: Uterus

Measurements: 11.1 x 5.7 x 7.2 cm. Uterus is anteverted. No
myometrial mass lesions identified.

Endometrium

Thickness: 4 mm. Small amount of fluid demonstrated in the
endometrium. No flow demonstrated in the endometrial contents on
color flow Doppler imaging suggesting less likely to be retained
products of conception.

Right ovary

Measurements: 3.9 x 1.8 x 2 cm. Normal appearance/no adnexal mass.

Left ovary

Left ovary was not visualized.

Other findings:  No free fluid
IMPRESSION: Endometrial stripe thickness is normal with small amount of
endometrial fluid demonstrated. No flow demonstrated in the
endometrium on color flow Doppler imaging suggesting changes less
likely to represent retained products of conception.

## 2016-01-14 ENCOUNTER — Other Ambulatory Visit (HOSPITAL_BASED_OUTPATIENT_CLINIC_OR_DEPARTMENT_OTHER): Payer: Self-pay | Admitting: Surgery

## 2016-01-14 DIAGNOSIS — K429 Umbilical hernia without obstruction or gangrene: Secondary | ICD-10-CM

## 2016-01-16 ENCOUNTER — Ambulatory Visit (HOSPITAL_BASED_OUTPATIENT_CLINIC_OR_DEPARTMENT_OTHER): Admission: RE | Admit: 2016-01-16 | Payer: Managed Care, Other (non HMO) | Source: Ambulatory Visit

## 2016-02-10 ENCOUNTER — Ambulatory Visit (HOSPITAL_BASED_OUTPATIENT_CLINIC_OR_DEPARTMENT_OTHER): Admission: RE | Admit: 2016-02-10 | Payer: Managed Care, Other (non HMO) | Source: Ambulatory Visit

## 2016-03-25 DIAGNOSIS — Z8271 Family history of polycystic kidney: Secondary | ICD-10-CM | POA: Insufficient documentation

## 2016-03-25 DIAGNOSIS — Q613 Polycystic kidney, unspecified: Secondary | ICD-10-CM | POA: Insufficient documentation

## 2016-05-08 ENCOUNTER — Encounter (HOSPITAL_BASED_OUTPATIENT_CLINIC_OR_DEPARTMENT_OTHER): Payer: Self-pay | Admitting: *Deleted

## 2016-05-08 ENCOUNTER — Emergency Department (HOSPITAL_BASED_OUTPATIENT_CLINIC_OR_DEPARTMENT_OTHER)
Admission: EM | Admit: 2016-05-08 | Discharge: 2016-05-08 | Disposition: A | Discharge status: Home / Self Care | Payer: Managed Care, Other (non HMO) | Attending: Emergency Medicine | Admitting: Emergency Medicine

## 2016-05-08 DIAGNOSIS — R51 Headache: Secondary | ICD-10-CM | POA: Insufficient documentation

## 2016-05-08 DIAGNOSIS — R110 Nausea: Secondary | ICD-10-CM | POA: Diagnosis not present

## 2016-05-08 DIAGNOSIS — R519 Headache, unspecified: Secondary | ICD-10-CM

## 2016-05-08 DIAGNOSIS — Z79899 Other long term (current) drug therapy: Secondary | ICD-10-CM | POA: Diagnosis not present

## 2016-05-08 LAB — URINALYSIS, ROUTINE W REFLEX MICROSCOPIC
Bilirubin Urine: NEGATIVE
Glucose, UA: NEGATIVE mg/dL
HGB URINE DIPSTICK: NEGATIVE
Ketones, ur: NEGATIVE mg/dL
Nitrite: NEGATIVE
PH: 6.5 (ref 5.0–8.0)
Protein, ur: NEGATIVE mg/dL
SPECIFIC GRAVITY, URINE: 1.007 (ref 1.005–1.030)

## 2016-05-08 LAB — URINE MICROSCOPIC-ADD ON: Squamous Epithelial / LPF: NONE SEEN

## 2016-05-08 LAB — PREGNANCY, URINE: Preg Test, Ur: NEGATIVE

## 2016-05-08 NOTE — Discharge Instructions (Signed)

## 2016-05-08 NOTE — ED Notes (Signed)
Patient states having HA x4-5 days. Today reports a feeling dizzy and nauseous today with a near syncopal episode at home. Patient states that she started a new birth control a few weeks ago. Denies any previous episodes of the same.

## 2016-05-08 NOTE — ED Notes (Signed)
Pt with HA x 4 days. Near syncopal episode x 45 min ago.  Describes dizziness as a  Feels like she is falling Pt also with Nausea.

## 2016-05-08 NOTE — ED Provider Notes (Signed)
CSN: [id number]     Arrival date & time 05/08/16  1341 History   First MD Initiated Contact with Patient 05/08/16 1355     Chief Complaint  Patient presents with  . Headache  . Dizziness     (Consider location/radiation/quality/duration/timing/severity/associated sxs/prior Treatment) Patient is a 35 y.o. female presenting with headaches and dizziness. The history is provided by the patient.  Headache Pain location:  Generalized Quality:  Dull Radiates to:  Does not radiate Onset quality:  Gradual Duration:  4 days Timing:  Constant Progression:  Unchanged Chronicity:  Recurrent Similar to prior headaches: yes   Context: not exposure to bright light   Relieved by:  Nothing Exacerbated by: head movement. Ineffective treatments:  NSAIDs Associated symptoms: dizziness   Associated symptoms: no fever, no syncope and no vomiting   Dizziness Associated symptoms: headaches   Associated symptoms: no syncope and no vomiting     Past Medical History  Diagnosis Date  . Tachycardia   . Migraine   . Back pain   . Neck pain   . Chronic headaches   . Seasonal allergies   . Environmental allergies     animals, dander   Past Surgical History  Procedure Laterality Date  . Wisdom tooth extraction    . Dilation and curettage of uterus  08/31/2014    Procedure: Dilatation and Currettage with Ultrasound Guidance;  Surgeon: Sharon Seller, DO;  Location: [facility];  Service: Gynecology;;   Family History  Problem Relation Age of Onset  . Breast cancer Maternal Grandmother   . Diabetes Father   . Kidney disease Father     transplant  .     Marland Kitchen Heart disease Maternal Grandfather   . Cancer Maternal Grandfather     testicular  . Thyroid disease Mother    Social History  Substance Use Topics  . Smoking status: Never Smoker   . Smokeless tobacco: Never Used  . Alcohol Use: No   OB History    Gravida Para Term Preterm AB TAB SAB Ectopic Multiple Living   Review of Systems  Constitutional: Negative for fever.  Cardiovascular: Negative for syncope.  Gastrointestinal: Negative for vomiting.  Neurological: Positive for dizziness and headaches.  All other systems reviewed and are negative.     Allergies  Penicillins and Claritin  Home Medications   Prior to Admission medications   Medication Sig Start Date End Date Taking? Authorizing Provider  ibuprofen (ADVIL,MOTRIN) 600 MG tablet Take 1 tablet (600 mg total) by mouth every 6 (six) hours. 08/04/14  Yes Gerrit Heck, CNM  UNKNOWN TO PATIENT BC control started x 2 weeks ago   Yes Historical Provider, MD   BP 147/96 mmHg  Pulse 84  Temp(Src) 98.6 F (37 C) (Oral)  Resp 16  Ht  (1.727 m)  Wt 135 lb (61.236 kg)  BMI 20.53 kg/m2  SpO2 100%  LMP 04/07/2016 Physical Exam  Constitutional: She is oriented to person, place, and time. She appears well-developed and well-nourished. No distress.  HENT:  Head: Normocephalic.  Eyes: Conjunctivae are normal.  Neck: Neck supple. No tracheal deviation present.  Cardiovascular: Normal rate and regular rhythm.   Pulmonary/Chest: Effort normal. No respiratory distress.  Abdominal: Soft. She exhibits no distension.  Neurological: She is alert and oriented to person, place, and time. She has normal strength. No cranial nerve deficit or sensory deficit. Coordination and gait normal. GCS  eye subscore is 4. GCS verbal subscore is 5. GCS motor subscore is 6.  Normal finger to nose testing and rapid alternating movement   Skin: Skin is warm and dry.  Psychiatric: She has a normal mood and affect.  Vitals reviewed.   ED Course  Procedures (including critical care time) Labs Review Labs Reviewed  URINALYSIS, ROUTINE W REFLEX MICROSCOPIC (NOT AT [facility]) - Abnormal; Notable for the following:    Leukocytes, UA TRACE (*)    All other components within normal limits  URINE MICROSCOPIC-ADD ON - Abnormal; Notable for the following:    Bacteria,  UA RARE (*)    All other components within normal limits  PREGNANCY, URINE    Imaging Review No results found. I have personally reviewed and evaluated these images and lab results as part of my medical decision-making.   EKG Interpretation None      MDM   Final diagnoses:  Left-sided headache    35 year old female presents with 4 days of ongoing gradual headache that has developed into some dizziness today where she feels like her head is still moving after it stops. She states that movement makes the pain worse. This is similar to prior headaches that had been well-controlled over the last year. She feels that this may be related to starting Microgestin for birth control one month ago and wishes to stop taking it. She has taken ibuprofen at home twice daily for her symptoms and not had any relief. She has no neurologic abnormalities on exam and is otherwise well-appearing. No indication for neuroimaging currently. I offered the patient a migraine cocktail but she states that she has had adverse reactions to both migraine cocktails and steroids and does not feel that the benefit of treatment would outweigh the side effects. I discussed optimal dosing of NSAIDs for supportive care measures and to stay hydrated. She is to follow her symptoms and see her primary care doctor for referral to neurology if she is continuing to have headaches or return with worsening or new neurologic symptoms.    Lyndal Pulleyaniel Alorah Mcree, MD 05/08/16 [phone]

## 2016-08-24 DIAGNOSIS — G4709 Other insomnia: Secondary | ICD-10-CM | POA: Insufficient documentation

## 2016-08-24 DIAGNOSIS — R110 Nausea: Secondary | ICD-10-CM | POA: Insufficient documentation

## 2016-08-24 DIAGNOSIS — F411 Generalized anxiety disorder: Secondary | ICD-10-CM | POA: Insufficient documentation

## 2016-08-24 DIAGNOSIS — F410 Panic disorder [episodic paroxysmal anxiety] without agoraphobia: Secondary | ICD-10-CM | POA: Insufficient documentation

## 2016-08-24 DIAGNOSIS — R5383 Other fatigue: Secondary | ICD-10-CM | POA: Insufficient documentation

## 2017-01-28 DIAGNOSIS — R3129 Other microscopic hematuria: Secondary | ICD-10-CM | POA: Insufficient documentation

## 2017-11-30 ENCOUNTER — Ambulatory Visit: Payer: Managed Care, Other (non HMO) | Admitting: Podiatry

## 2017-12-14 ENCOUNTER — Ambulatory Visit: Payer: Managed Care, Other (non HMO) | Admitting: Podiatry

## 2017-12-28 ENCOUNTER — Ambulatory Visit: Payer: Managed Care, Other (non HMO) | Admitting: Podiatry

## 2017-12-30 ENCOUNTER — Encounter: Payer: Self-pay | Admitting: Podiatry

## 2017-12-30 ENCOUNTER — Ambulatory Visit (INDEPENDENT_AMBULATORY_CARE_PROVIDER_SITE_OTHER): Payer: Managed Care, Other (non HMO)

## 2017-12-30 ENCOUNTER — Ambulatory Visit: Payer: Managed Care, Other (non HMO) | Admitting: Podiatry

## 2017-12-30 DIAGNOSIS — G5781 Other specified mononeuropathies of right lower limb: Secondary | ICD-10-CM

## 2017-12-30 DIAGNOSIS — G5761 Lesion of plantar nerve, right lower limb: Secondary | ICD-10-CM

## 2017-12-30 DIAGNOSIS — M6200 Separation of muscle (nontraumatic), unspecified site: Secondary | ICD-10-CM | POA: Insufficient documentation

## 2017-12-30 NOTE — Patient Instructions (Signed)
Morton Neuralgia Morton neuralgia is a type of foot pain in the area closest to your toes. This area is sometimes called the ball of your foot. Morton neuralgia occurs when a branch of a nerve in your foot (digital nerve) becomes compressed. When this happens over a long period of time, the nerve can thicken (neuroma) and cause pain. This usually occurs between the third and fourth toe. Morton neuralgia can come and go but may get worse over time. What are the causes? Your digital nerve can become compressed and stretched at a point where it passes under a thick band of tissue that connects your toes (intermetatarsal ligament). Morton neuralgia can be caused by mild repetitive damage in this area. This type of damage can result from:  Activities such as running or jumping.  Wearing shoes that are too tight.  What increases the risk? You may be at risk for Morton neuralgia if you:  Are female.  Wear high heels.  Wear shoes that are narrow or tight.  Participate in activities that stretch your toes. These include: ? Running. ? Ballet. ? Long-distance walking.  What are the signs or symptoms? The first symptom of Morton neuralgia is pain that spreads from the ball of your foot to your toes. It may feel like you are walking on a marble. Pain usually gets worse with walking and goes away at night. Other symptoms may include numbness and cramping of your toes. How is this diagnosed? Your health care provider will do a physical exam. When doing the exam, your health care provider may:  Squeeze your foot just behind your toe.  Ask you to move your toes to check for pain.  You may also have tests on your foot to confirm the diagnosis. These may include:  An X-ray.  An MRI.  How is this treated? Treatment for Morton neuralgia may be as simple as changing the kind of shoes you wear. Other treatments may include:  Wearing a supportive pad (orthosis) under the front of your foot. This  lifts your toe bones and takes pressure off the nerve.  Getting injections of numbing medicine and anti-inflammatory medicine (steroid) in the nerve.  Having surgery to remove part of the thickened nerve.  Follow these instructions at home:  Take medicine only as directed by your health care provider.  Wear soft-soled shoes with a wide toe area.  Stop activities that may be causing pain.  Elevate your foot when resting.  Massage your foot.  Apply ice to the injured area: ? Put ice in a plastic bag. ? Place a towel between your skin and the bag. ? Leave the ice on for 20 minutes, 2-3 times a day.  Keep all follow-up visits as directed by your health care provider. This is important. Contact a health care provider if:  Home care instructions are not helping you get better.  Your symptoms change or get worse. This information is not intended to replace advice given to you by your health care provider. Make sure you discuss any questions you have with your health care provider. Document Released: 01/11/2001 Document Revised: 03/12/2016 Document Reviewed: 12/06/2013 Elsevier Interactive Patient Education  2018 [employer]vier Inc.  

## 2017-12-30 NOTE — Progress Notes (Signed)
  Subjective:  Patient ID: [name], female    DOB: 26-Jul-1981,  MRN: [id number] HPI Chief Complaint  Patient presents with  . Foot Pain    Patient was treated here in 2014-2015 for neuroma right foot, stopped coming due to pregnancy since she was no longer eligible for the neurolysis injections, foot did start to feel better, but now hurting again, trying to wear good supportive sneakers    37 y.o. female presents with the above complaint.   ROS denies fever chills nausea vomiting muscle aches and pains.  Denies shortness of breath chest pain.  Past Medical History:  Diagnosis Date  . Back pain   . Chronic headaches   . Environmental allergies    animals, dander  . Migraine   . Neck pain   . Seasonal allergies   . Tachycardia    Past Surgical History:  Procedure Laterality Date  . DILATION AND CURETTAGE OF UTERUS  08/31/2014   Procedure: Dilatation and Currettage with Ultrasound Guidance;  Surgeon: Stephanie SellerJennifer M Ozan, DO;  Location: [facility];  Service: Gynecology;;  . Stephanie Wagner      Current Outpatient Medications:  Marland Kitchen.  UNKNOWN TO PATIENT, BC control started x 2 weeks ago, Disp: , Rfl:   Allergies  Allergen Reactions  . Penicillins Nausea And Vomiting  . Claritin [Loratadine] Palpitations    Just during pregnancy   Review of Systems  All other systems reviewed and are negative.  Objective:  There were no vitals filed for this visit.  General: Well developed, nourished, in no acute distress, alert and oriented x3   Dermatological: Skin is warm, dry and supple bilateral. Nails x 10 are well maintained; remaining integument appears unremarkable at this time. There are no open sores, no preulcerative lesions, no rash or signs of infection present.  Vascular: Dorsalis Pedis artery and Posterior Tibial artery pedal pulses are 2/4 bilateral with immedate capillary fill time. Pedal hair growth present. No varicosities and no lower extremity edema present  bilateral.   Neruologic: Grossly intact via light touch bilateral. Vibratory intact via tuning fork bilateral. Protective threshold with Semmes Wienstein monofilament intact to all pedal sites bilateral. Patellar and Achilles deep tendon reflexes 2+ bilateral. No Babinski or clonus noted bilateral.  Pain on palpation to the third interdigital space of the right foot with a palpable Mulder's click.  Musculoskeletal: No gross boney pedal deformities bilateral. No pain, crepitus, or limitation noted with foot and ankle range of motion bilateral. Muscular strength 5/5 in all groups tested bilateral.  Gait: Unassisted, Nonantalgic.    Radiographs:  Reviewed  Assessment & Plan:   Assessment: Neuroma third interdigital space right foot.  Plan: Discussed etiology pathology conservative versus surgical therapies.  At this point after sterile Betadine skin prep and consent I injected 20 mill grams Kenalog 5 mg Marcaine to the third interdigital space of the right foot.  She tolerated procedure well.  We will follow-up with her in 3 weeks for possible neural lysis.     Stephanie Wagner, [place]

## 2018-01-20 ENCOUNTER — Ambulatory Visit: Payer: Managed Care, Other (non HMO) | Admitting: Podiatry

## 2018-01-20 ENCOUNTER — Encounter: Payer: Self-pay | Admitting: Podiatry

## 2018-01-20 DIAGNOSIS — G5761 Lesion of plantar nerve, right lower limb: Secondary | ICD-10-CM | POA: Diagnosis not present

## 2018-01-20 DIAGNOSIS — G5781 Other specified mononeuropathies of right lower limb: Secondary | ICD-10-CM

## 2018-01-20 NOTE — Progress Notes (Signed)
She presents today for follow-up of her neuroma third interdigital space of the right foot.  She states is really about the same after that injection of cortisone.  She is referring to the third intermetatarsal space of the right foot.  Objective: Vital signs are stable alert and oriented x3.  Pulses are palpable.  Neurologic sensorium is intact.  Deep tendon reflexes are intact.  Muscle strength was 5/5 dorsiflexors plantar flexors inverters everters onto the musculature is intact palpable Mulder's click third interspace of the right foot is still present.  No open lesions or wounds are noted.  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  Plan: At this point we will going to start her on dehydrated alcohol to the third interdigital space of the right foot.  This is performed after Betadine skin prep 2 cc of a 4% dehydrated alcohol mix for local anesthetic was injected.  She tolerated procedure well without complications follow-up with her in 3 weeks to 4 weeks.

## 2018-02-17 ENCOUNTER — Ambulatory Visit: Payer: Managed Care, Other (non HMO) | Admitting: Podiatry

## 2018-03-08 ENCOUNTER — Ambulatory Visit: Payer: Managed Care, Other (non HMO) | Admitting: Podiatry

## 2018-03-24 ENCOUNTER — Ambulatory Visit: Payer: Managed Care, Other (non HMO) | Admitting: Podiatry

## 2018-03-24 DIAGNOSIS — G5761 Lesion of plantar nerve, right lower limb: Secondary | ICD-10-CM

## 2018-03-24 DIAGNOSIS — G5781 Other specified mononeuropathies of right lower limb: Secondary | ICD-10-CM

## 2018-03-24 NOTE — Progress Notes (Signed)
She presents today for follow-up of her neuroma.  She states that it seems to be improving a lot.  Objective: Vital signs are stable alert and oriented x3.  Pulses are palpable.  Still has pain on palpation to the third interdigital space of the right foot with palpable Mulder's click.  Assessment: Pain in limb secondary to neuroma.  Plan: Injected 2 cc of 4% dehydrated alcohol mix for local anesthetic to the point of maximal tenderness after local area was wiped with alcohol and Betadine.  Tolerated procedure well without complications this was her third dehydrated alcohol injection we will follow-up with her in 1 month

## 2018-03-28 ENCOUNTER — Telehealth: Payer: Self-pay | Admitting: *Deleted

## 2018-03-28 NOTE — Telephone Encounter (Signed)
Pt states she has been getting injections for the right foot neuroma. Pt states the last injection has caused a lot of pain and the tendons on the right foot are extended and can be seen on top not like the left foot, pt would like to know if this is normal.

## 2018-03-28 NOTE — Telephone Encounter (Signed)
I informed pt of Dr. Geryl RankinsHyatt's recommendation to treat the inflammation and cramping/spasms with medrol dose pack. Pt states it is much better and doesn't want the steroid pack. I told pt to ice the area it would help with the inflammation and to protect the skin from the ice with a sock.

## 2018-03-28 NOTE — Telephone Encounter (Signed)
Some yes.  Might want to get her a medrol dose pack.

## 2018-04-19 ENCOUNTER — Encounter (INDEPENDENT_AMBULATORY_CARE_PROVIDER_SITE_OTHER): Payer: Managed Care, Other (non HMO) | Admitting: Podiatry

## 2018-04-19 NOTE — Progress Notes (Signed)
This encounter was created in error - please disregard.

## 2018-04-20 ENCOUNTER — Telehealth: Payer: Self-pay | Admitting: Podiatry

## 2018-04-20 NOTE — Telephone Encounter (Signed)
Patient called about the injection she received over a month ago and stating she's still having pain from the injection. Please call patient back at [phone]

## 2018-04-20 NOTE — Telephone Encounter (Signed)
Left message informing pt if she was having pain a month from the injection, the problem neuroma she received the injection for is still inflamed and needs additional treatment to call our office for an appt.

## 2018-05-17 ENCOUNTER — Encounter: Payer: Self-pay | Admitting: Podiatry

## 2018-05-17 ENCOUNTER — Ambulatory Visit: Payer: Managed Care, Other (non HMO) | Admitting: Podiatry

## 2018-05-17 DIAGNOSIS — G5781 Other specified mononeuropathies of right lower limb: Secondary | ICD-10-CM

## 2018-05-17 DIAGNOSIS — G5761 Lesion of plantar nerve, right lower limb: Secondary | ICD-10-CM

## 2018-05-18 NOTE — Progress Notes (Signed)
She presents today for follow-up of neuroma third interdigital space of the right foot.  States that without the injection last time this will be my last injection however it hurts so bad and it is still hurting horribly I do not think that I want another injection at this point until this 1 wears off.  Objective: Vital signs are stable she is alert and oriented x3.  Pulses are palpable.  She has severe pain on palpation medial continue tubercle of the right third interdigital space.  There is some fat atrophy in this area more than likely due to the initial steroid injection.  Assessment: Neuroma third interdigital space right painful from previous injections.  Plan: At this point I was prepared to give her another injection however she stated that she did not want one because it was still tender I expressed to her the usually another injection will take away that tenderness in the neuroma symptoms will subside she is requesting that we wait until they subside before another injection so at this time we will wait until she notifies us.

## 2018-12-09 ENCOUNTER — Inpatient Hospital Stay (HOSPITAL_COMMUNITY)
Admission: AD | Admit: 2018-12-09 | Discharge: 2018-12-14 | DRG: 250 | Disposition: A | Discharge status: Home / Self Care | Payer: No Typology Code available for payment source | Attending: Internal Medicine | Admitting: Internal Medicine

## 2018-12-09 ENCOUNTER — Other Ambulatory Visit: Payer: Self-pay

## 2018-12-09 ENCOUNTER — Encounter (HOSPITAL_COMMUNITY): Payer: Self-pay

## 2018-12-09 ENCOUNTER — Inpatient Hospital Stay (HOSPITAL_COMMUNITY): Admission: AD | Disposition: A | Discharge status: Home / Self Care | Payer: Self-pay | Attending: Internal Medicine

## 2018-12-09 DIAGNOSIS — Q613 Polycystic kidney, unspecified: Secondary | ICD-10-CM | POA: Diagnosis not present

## 2018-12-09 DIAGNOSIS — I2542 Coronary artery dissection: Secondary | ICD-10-CM | POA: Diagnosis not present

## 2018-12-09 DIAGNOSIS — Z8249 Family history of ischemic heart disease and other diseases of the circulatory system: Secondary | ICD-10-CM | POA: Diagnosis not present

## 2018-12-09 DIAGNOSIS — K769 Liver disease, unspecified: Secondary | ICD-10-CM | POA: Diagnosis present

## 2018-12-09 DIAGNOSIS — Z803 Family history of malignant neoplasm of breast: Secondary | ICD-10-CM

## 2018-12-09 DIAGNOSIS — Z888 Allergy status to other drugs, medicaments and biological substances status: Secondary | ICD-10-CM

## 2018-12-09 DIAGNOSIS — I2102 ST elevation (STEMI) myocardial infarction involving left anterior descending coronary artery: Secondary | ICD-10-CM | POA: Diagnosis not present

## 2018-12-09 DIAGNOSIS — G43909 Migraine, unspecified, not intractable, without status migrainosus: Secondary | ICD-10-CM | POA: Diagnosis present

## 2018-12-09 DIAGNOSIS — I2510 Atherosclerotic heart disease of native coronary artery without angina pectoris: Secondary | ICD-10-CM | POA: Diagnosis present

## 2018-12-09 DIAGNOSIS — Z841 Family history of disorders of kidney and ureter: Secondary | ICD-10-CM | POA: Diagnosis not present

## 2018-12-09 DIAGNOSIS — Z880 Allergy status to penicillin: Secondary | ICD-10-CM

## 2018-12-09 DIAGNOSIS — R079 Chest pain, unspecified: Secondary | ICD-10-CM | POA: Diagnosis present

## 2018-12-09 DIAGNOSIS — Z9861 Coronary angioplasty status: Secondary | ICD-10-CM

## 2018-12-09 DIAGNOSIS — Z833 Family history of diabetes mellitus: Secondary | ICD-10-CM

## 2018-12-09 HISTORY — PX: CORONARY/GRAFT ACUTE MI REVASCULARIZATION: CATH118305

## 2018-12-09 HISTORY — PX: LEFT HEART CATH AND CORONARY ANGIOGRAPHY: CATH118249

## 2018-12-09 LAB — TROPONIN I
TROPONIN I: 7.64 ng/mL — AB (ref <0.03)
Troponin I: 0.14 ng/mL (ref <0.03)
Troponin I: 15.35 ng/mL (ref <0.03)

## 2018-12-09 LAB — LIPID PANEL
CHOL/HDL RATIO: 2.7 ratio
Cholesterol: 152 mg/dL (ref 0–200)
HDL: 57 mg/dL (ref >40)
LDL CALC: 82 mg/dL (ref 0–99)
Triglycerides: 66 mg/dL (ref <150)
VLDL: 13 mg/dL (ref 0–40)

## 2018-12-09 LAB — COMPREHENSIVE METABOLIC PANEL
ALBUMIN: 3.8 g/dL (ref 3.5–5.0)
ALT: 28 U/L (ref 0–44)
AST: 33 U/L (ref 15–41)
Alkaline Phosphatase: 67 U/L (ref 38–126)
Anion gap: 9 (ref 5–15)
BUN: 10 mg/dL (ref 6–20)
CHLORIDE: 105 mmol/L (ref 98–111)
CO2: 22 mmol/L (ref 22–32)
Calcium: 9.4 mg/dL (ref 8.9–10.3)
Creatinine, Ser: 0.69 mg/dL (ref 0.44–1.00)
GFR calc Af Amer: >60 mL/min (ref >60)
GLUCOSE: 131 mg/dL — AB (ref 70–99)
POTASSIUM: 3.6 mmol/L (ref 3.5–5.1)
Sodium: 136 mmol/L (ref 135–145)
Total Bilirubin: 0.7 mg/dL (ref 0.3–1.2)
Total Protein: 6.5 g/dL (ref 6.5–8.1)

## 2018-12-09 LAB — BASIC METABOLIC PANEL
Anion gap: 10 (ref 5–15)
BUN: 11 mg/dL (ref 6–20)
CO2: 21 mmol/L — ABNORMAL LOW (ref 22–32)
Calcium: 8.8 mg/dL — ABNORMAL LOW (ref 8.9–10.3)
Chloride: 104 mmol/L (ref 98–111)
Creatinine, Ser: 0.86 mg/dL (ref 0.44–1.00)
GFR calc Af Amer: >60 mL/min (ref >60)
GFR calc non Af Amer: >60 mL/min (ref >60)
Glucose, Bld: 184 mg/dL — ABNORMAL HIGH (ref 70–99)
Potassium: 3.8 mmol/L (ref 3.5–5.1)
SODIUM: 135 mmol/L (ref 135–145)

## 2018-12-09 LAB — HEMOGLOBIN A1C
Hgb A1c MFr Bld: 4.9 % (ref 4.8–5.6)
MEAN PLASMA GLUCOSE: 93.93 mg/dL

## 2018-12-09 LAB — CBC
HCT: 37.9 % (ref 36.0–46.0)
HEMOGLOBIN: 12.7 g/dL (ref 12.0–15.0)
MCH: 28.6 pg (ref 26.0–34.0)
MCHC: 33.5 g/dL (ref 30.0–36.0)
MCV: 85.4 fL (ref 80.0–100.0)
Platelets: 271 10*3/uL (ref 150–400)
RBC: 4.44 MIL/uL (ref 3.87–5.11)
RDW: 12.9 % (ref 11.5–15.5)
WBC: 6.9 10*3/uL (ref 4.0–10.5)
nRBC: 0 % (ref 0.0–0.2)

## 2018-12-09 LAB — POCT ACTIVATED CLOTTING TIME: ACTIVATED CLOTTING TIME: 142 s

## 2018-12-09 LAB — PROTIME-INR
INR: 1.07
PROTHROMBIN TIME: 13.8 s (ref 11.4–15.2)

## 2018-12-09 LAB — APTT: aPTT: 31 seconds (ref 24–36)

## 2018-12-09 LAB — MRSA PCR SCREENING: MRSA by PCR: NEGATIVE

## 2018-12-09 LAB — PREGNANCY, URINE: Preg Test, Ur: NEGATIVE

## 2018-12-09 SURGERY — CORONARY/GRAFT ACUTE MI REVASCULARIZATION
Anesthesia: LOCAL

## 2018-12-09 MED ORDER — ACETAMINOPHEN 325 MG PO TABS
650.0000 mg | ORAL_TABLET | ORAL | Status: DC | PRN
  Administered 2018-12-09 – 2018-12-11 (×2): 650 mg via ORAL
  Filled 2018-12-09 (×2): qty 2

## 2018-12-09 MED ORDER — SODIUM CHLORIDE 0.9 % IV SOLN: INTRAVENOUS | Status: AC

## 2018-12-09 MED ORDER — LIDOCAINE HCL (PF) 1 % IJ SOLN
INTRAMUSCULAR | Status: AC
  Filled 2018-12-09: qty 30

## 2018-12-09 MED ORDER — HEPARIN (PORCINE) IN NACL 1000-0.9 UT/500ML-% IV SOLN
INTRAVENOUS | Status: DC | PRN
  Administered 2018-12-09 (×2): 500 mL

## 2018-12-09 MED ORDER — METHYLPREDNISOLONE SODIUM SUCC 125 MG IJ SOLR
INTRAMUSCULAR | Status: AC
  Filled 2018-12-09: qty 2

## 2018-12-09 MED ORDER — MIDAZOLAM HCL 2 MG/2ML IJ SOLN
INTRAMUSCULAR | Status: DC | PRN
  Administered 2018-12-09: 0.5 mg via INTRAVENOUS

## 2018-12-09 MED ORDER — IOHEXOL 350 MG/ML SOLN
INTRAVENOUS | Status: DC | PRN
  Administered 2018-12-09: 105 mL via INTRACARDIAC

## 2018-12-09 MED ORDER — SODIUM CHLORIDE 0.9 % IV SOLN
4.0000 ug/kg/min | INTRAVENOUS | Status: AC
  Filled 2018-12-09: qty 50

## 2018-12-09 MED ORDER — SODIUM CHLORIDE 0.9% FLUSH
3.0000 mL | Freq: Two times a day (BID) | INTRAVENOUS | Status: DC
  Administered 2018-12-10 – 2018-12-14 (×9): 3 mL via INTRAVENOUS

## 2018-12-09 MED ORDER — NITROGLYCERIN 1 MG/10 ML FOR IR/CATH LAB
INTRA_ARTERIAL | Status: DC | PRN
  Administered 2018-12-09: 200 ug via INTRACORONARY

## 2018-12-09 MED ORDER — ATORVASTATIN CALCIUM 40 MG PO TABS
40.0000 mg | ORAL_TABLET | Freq: Every day | ORAL | Status: DC
  Administered 2018-12-10 – 2018-12-13 (×4): 40 mg via ORAL
  Filled 2018-12-09 (×4): qty 1

## 2018-12-09 MED ORDER — VERAPAMIL HCL 2.5 MG/ML IV SOLN
INTRAVENOUS | Status: AC
  Filled 2018-12-09: qty 2

## 2018-12-09 MED ORDER — CANGRELOR TETRASODIUM 50 MG IV SOLR
INTRAVENOUS | Status: AC
  Filled 2018-12-09: qty 50

## 2018-12-09 MED ORDER — AMIODARONE HCL 150 MG/3ML IV SOLN
INTRAVENOUS | Status: DC | PRN
  Administered 2018-12-09: 150 mg via INTRAVENOUS

## 2018-12-09 MED ORDER — NITROGLYCERIN IN D5W 200-5 MCG/ML-% IV SOLN
INTRAVENOUS | Status: AC
  Filled 2018-12-09: qty 250

## 2018-12-09 MED ORDER — DIPHENHYDRAMINE HCL 50 MG/ML IJ SOLN
INTRAMUSCULAR | Status: AC
  Filled 2018-12-09: qty 1

## 2018-12-09 MED ORDER — SODIUM CHLORIDE 0.9 % IV BOLUS
500.0000 mL | Freq: Once | INTRAVENOUS | Status: AC
  Administered 2018-12-09: 500 mL via INTRAVENOUS

## 2018-12-09 MED ORDER — LIDOCAINE HCL (PF) 1 % IJ SOLN
INTRAMUSCULAR | Status: DC | PRN
  Administered 2018-12-09: 15 mL

## 2018-12-09 MED ORDER — FAMOTIDINE IN NACL 20-0.9 MG/50ML-% IV SOLN
INTRAVENOUS | Status: AC
  Filled 2018-12-09: qty 50

## 2018-12-09 MED ORDER — MIDAZOLAM HCL 2 MG/2ML IJ SOLN
INTRAMUSCULAR | Status: AC
Start: 2018-12-09 — End: ?
  Filled 2018-12-09: qty 2

## 2018-12-09 MED ORDER — FENTANYL CITRATE (PF) 100 MCG/2ML IJ SOLN
INTRAMUSCULAR | Status: DC | PRN
  Administered 2018-12-09: 12.5 ug via INTRAVENOUS

## 2018-12-09 MED ORDER — SODIUM CHLORIDE 0.9 % IV SOLN: 250.0000 mL | INTRAVENOUS | Status: DC | PRN

## 2018-12-09 MED ORDER — HEPARIN SODIUM (PORCINE) 1000 UNIT/ML IJ SOLN
INTRAMUSCULAR | Status: AC
  Filled 2018-12-09: qty 1

## 2018-12-09 MED ORDER — FENTANYL CITRATE (PF) 100 MCG/2ML IJ SOLN
INTRAMUSCULAR | Status: AC
  Filled 2018-12-09: qty 2

## 2018-12-09 MED ORDER — SODIUM CHLORIDE 0.9 % IV SOLN
INTRAVENOUS | Status: AC | PRN
  Administered 2018-12-09: 4 ug/kg/min via INTRAVENOUS

## 2018-12-09 MED ORDER — SODIUM CHLORIDE 0.9% FLUSH: 3.0000 mL | INTRAVENOUS | Status: DC | PRN

## 2018-12-09 MED ORDER — CANGRELOR BOLUS VIA INFUSION
INTRAVENOUS | Status: DC | PRN
  Administered 2018-12-09: 1974 ug via INTRAVENOUS

## 2018-12-09 MED ORDER — METOPROLOL TARTRATE 25 MG PO TABS
37.5000 mg | ORAL_TABLET | Freq: Two times a day (BID) | ORAL | Status: DC
  Administered 2018-12-09 – 2018-12-12 (×6): 37.5 mg via ORAL
  Filled 2018-12-09 (×7): qty 1

## 2018-12-09 MED ORDER — DIPHENHYDRAMINE HCL 50 MG/ML IJ SOLN
INTRAMUSCULAR | Status: DC | PRN
Start: 2018-12-09 — End: 2018-12-09
  Administered 2018-12-09: 50 mg via INTRAVENOUS

## 2018-12-09 MED ORDER — AMIODARONE HCL 150 MG/3ML IV SOLN
INTRAVENOUS | Status: AC
  Filled 2018-12-09: qty 3

## 2018-12-09 MED ORDER — HYDRALAZINE HCL 20 MG/ML IJ SOLN: 5.0000 mg | INTRAMUSCULAR | Status: AC | PRN

## 2018-12-09 MED ORDER — HEPARIN SODIUM (PORCINE) 1000 UNIT/ML IJ SOLN
INTRAMUSCULAR | Status: DC | PRN
  Administered 2018-12-09: 5000 [IU] via INTRAVENOUS
  Administered 2018-12-09: 3000 [IU] via INTRAVENOUS

## 2018-12-09 MED ORDER — ASPIRIN 81 MG PO CHEW
81.0000 mg | CHEWABLE_TABLET | Freq: Every day | ORAL | Status: DC
  Administered 2018-12-10 – 2018-12-13 (×4): 81 mg via ORAL
  Filled 2018-12-09 (×4): qty 1

## 2018-12-09 MED ORDER — CLOPIDOGREL BISULFATE 300 MG PO TABS
ORAL_TABLET | ORAL | Status: AC
  Filled 2018-12-09: qty 2

## 2018-12-09 MED ORDER — ONDANSETRON HCL 4 MG/2ML IJ SOLN
INTRAMUSCULAR | Status: AC
  Filled 2018-12-09: qty 2

## 2018-12-09 MED ORDER — LABETALOL HCL 5 MG/ML IV SOLN: 10.0000 mg | INTRAVENOUS | Status: AC | PRN

## 2018-12-09 MED ORDER — FAMOTIDINE IN NACL 20-0.9 MG/50ML-% IV SOLN
20.0000 mg | Freq: Once | INTRAVENOUS | Status: AC
  Administered 2018-12-09: 20 mg via INTRAVENOUS

## 2018-12-09 MED ORDER — CLOPIDOGREL BISULFATE 75 MG PO TABS
600.0000 mg | ORAL_TABLET | Freq: Once | ORAL | Status: AC
  Administered 2018-12-09: 600 mg via ORAL

## 2018-12-09 MED ORDER — KETOROLAC TROMETHAMINE 15 MG/ML IJ SOLN
15.0000 mg | Freq: Three times a day (TID) | INTRAMUSCULAR | Status: DC | PRN
  Administered 2018-12-09: 15 mg via INTRAVENOUS
  Filled 2018-12-09: qty 1

## 2018-12-09 MED ORDER — HEPARIN (PORCINE) IN NACL 1000-0.9 UT/500ML-% IV SOLN
INTRAVENOUS | Status: AC
  Filled 2018-12-09: qty 500

## 2018-12-09 MED ORDER — NITROGLYCERIN IN D5W 200-5 MCG/ML-% IV SOLN: 0.0000 ug/min | INTRAVENOUS | Status: DC

## 2018-12-09 MED ORDER — METOPROLOL TARTRATE 12.5 MG HALF TABLET: 12.5000 mg | ORAL_TABLET | Freq: Two times a day (BID) | ORAL | Status: DC

## 2018-12-09 MED ORDER — NITROGLYCERIN IN D5W 200-5 MCG/ML-% IV SOLN
INTRAVENOUS | Status: AC | PRN
  Administered 2018-12-09: 10 ug/min via INTRAVENOUS

## 2018-12-09 MED ORDER — ONDANSETRON HCL 4 MG/2ML IJ SOLN
4.0000 mg | Freq: Four times a day (QID) | INTRAMUSCULAR | Status: DC | PRN
  Administered 2018-12-09 – 2018-12-10 (×3): 4 mg via INTRAVENOUS
  Filled 2018-12-09 (×2): qty 2

## 2018-12-09 MED ORDER — CLOPIDOGREL BISULFATE 75 MG PO TABS
75.0000 mg | ORAL_TABLET | Freq: Every day | ORAL | Status: DC
  Administered 2018-12-10 – 2018-12-14 (×5): 75 mg via ORAL
  Filled 2018-12-09 (×5): qty 1

## 2018-12-09 MED ORDER — NITROGLYCERIN 1 MG/10 ML FOR IR/CATH LAB
INTRA_ARTERIAL | Status: AC
  Filled 2018-12-09: qty 10

## 2018-12-09 MED ORDER — METHYLPREDNISOLONE SODIUM SUCC 125 MG IJ SOLR
INTRAMUSCULAR | Status: DC | PRN
  Administered 2018-12-09: 125 mg via INTRAVENOUS

## 2018-12-09 SURGICAL SUPPLY — 22 items
BALLN SAPPHIRE 2.0X12 (BALLOONS) ×2
BALLOON SAPPHIRE 2.0X12 (BALLOONS) ×1 IMPLANT
CATH INFINITI 5FR ANG PIGTAIL (CATHETERS) ×2 IMPLANT
CATH INFINITI JR4 5F (CATHETERS) ×2 IMPLANT
CATH VISTA GUIDE 6FR XB3.5 (CATHETERS) ×2 IMPLANT
ELECT DEFIB PAD ADLT CADENCE (PAD) ×2 IMPLANT
GLIDESHEATH SLEND SS 6F .021 (SHEATH) ×2 IMPLANT
GUIDEWIRE INQWIRE 1.5J.035X260 (WIRE) IMPLANT
INQWIRE 1.5J .035X260CM (WIRE)
KIT ENCORE 26 ADVANTAGE (KITS) ×2 IMPLANT
KIT HEART LEFT (KITS) ×2 IMPLANT
KIT MICROPUNCTURE NIT STIFF (SHEATH) ×2 IMPLANT
PACK CARDIAC CATHETERIZATION (CUSTOM PROCEDURE TRAY) ×2 IMPLANT
SHEATH PINNACLE 6F 10CM (SHEATH) ×2 IMPLANT
SHEATH PINNACLE 7F 10CM (SHEATH) ×2 IMPLANT
SHEATH PROBE COVER 6X72 (BAG) ×2 IMPLANT
SYR MEDRAD MARK 7 150ML (SYRINGE) IMPLANT
TRANSDUCER W/STOPCOCK (MISCELLANEOUS) ×2 IMPLANT
TUBING CIL FLEX 10 FLL-RA (TUBING) ×2 IMPLANT
WIRE ASAHI SION 190X3X12 .014 (WIRE) ×2 IMPLANT
WIRE EMERALD 3MM-J .035X150CM (WIRE) ×2 IMPLANT
WIRE SION BLUE 180 (WIRE) IMPLANT

## 2018-12-09 NOTE — Progress Notes (Addendum)
Site area: Right groin a 7 french arterial sheath was removed Vaughan Sine RN  Site Prior to Removal:  Level 0  Pressure Applied For 25 MINUTES    Bedrest Beginning at   Manual:   Yes.    Patient Status During Pull:  stable  Post Pull Groin Site:  Level 0  Post Pull Instructions Given:  Yes.    Post Pull Pulses Present:  Yes.    Dressing Applied:  Yes.    Comments:  VS remain stable

## 2018-12-09 NOTE — Brief Op Note (Signed)
BRIEF CARDIAC CATHETERIZATION NOTE  DATE: 12/09/2018 TIME: 12:15 PM  PATIENT:  Stephanie Wagner  38 y.o. female  PRE-OPERATIVE DIAGNOSIS:  STEMI  POST-OPERATIVE DIAGNOSIS:  STEMI  PROCEDURE:  Procedure(s): CORONARY/GRAFT ACUTE MI REVASCULARIZATION (N/A) CORONARY BALLOON ANGIOPLASTY (N/A)  SURGEON:  Surgeon(s) and Role:    * Larrisa Cravey, Cristal Deer, MD - Primary  FINDINGS: 1. 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 2. Normal LVEDP. 3. Balloon angioplasty to distal LAD with reestablishment of flow.  The apical LAD remains occluded. 4. Possible IV contrast reaction, improved with administration of methylprednisolone 125 mg x 1 and diphenhydramine 50 mg x 1.  RECOMMENDATIONS: 1. Medical therapy. 2. Discontinue cangrelor at 1:30 pm; administer clopidogrel 600 mg x 1 at 1:30 PM. 3. DAPT with ASA and clopidogrel for at least 1 month, then consider single antiplatelet therapy with aspirin 81 mg daily. 4. Titrate NTG infusion for relief of chest pain. 5. Obtain echocardiogram.  Yvonne Kendall, MD [facility] Pager: [phone]

## 2018-12-09 NOTE — Progress Notes (Signed)
Orders received from Cardiology, Dr. Aron Baba: NS bolus now, then another NS bolus 30 minutes later, PRN ketorolac & restart Nitro gtt once BP stable. Assessment ongoing.

## 2018-12-09 NOTE — Plan of Care (Signed)

## 2018-12-09 NOTE — H&P (Addendum)
Cardiology Admission History and Physical:   Patient ID: Unk Stephanie Wagner MRN: [id number]; DOB: 06-06-1981   Admission date: 12/09/2018  Primary Care Provider: Patient, No Pcp Per Primary Cardiologist: New  Chief Complaint:  Chest pressure  Patient Profile:   [name] is a 38 y.o. female with no prior medical hx presented with code STEMI by EMS.   History of Present Illness:   Ms. Stephanie Wagner was in [facility] until this morning when had substernal chest pressure 10am. Radiating to Left arm with dyspnea. EMS found anterior St elevation and code STEMI activated. She got ASA 324mg , SL nitro x 2 and Zofran en route. Upon ER arrival she continued to have 8/10 chest pressure, radiating to left arm. Taken emergently to cath lab.   No hx of tobacco smoking or illicit drug use. LMP 2 weeks.   Past Medical History:  Diagnosis Date  . Back pain   . Chronic headaches   . Environmental allergies    animals, dander  . Migraine   . Neck pain   . Seasonal allergies   . Tachycardia     Past Surgical History:  Procedure Laterality Date  . DILATION AND CURETTAGE OF UTERUS  08/31/2014   Procedure: Dilatation and Currettage with Ultrasound Guidance;  Surgeon: Sharon SellerJennifer M Ozan, DO;  Location: [facility];  Service: Gynecology;;  . Leone HavenWISDOM TOOTH EXTRACTION       Medications Prior to Admission: Prior to Admission medications   Medication Sig Start Date Daemion Mcniel Date Taking? Authorizing Provider  norethindrone-ethinyl estradiol (MICROGESTIN,JUNEL,LOESTRIN) 1-20 MG-MCG tablet TK 1 T PO QD 05/04/18   [provider]     Allergies:    Allergies  Allergen Reactions  . Penicillins Nausea And Vomiting  . Claritin [Loratadine] Palpitations    Just during pregnancy    Social History:   Social History   Socioeconomic History  . Marital status: Married    Spouse name: Not on file  . Number of children: Not on file  . Years of education: Not on file  . Highest education level: Not on file    Occupational History  . Not on file  Social Needs  . Financial resource strain: Not on file  . Food insecurity:    Worry: Not on file    Inability: Not on file  . Transportation needs:    Medical: Not on file    Non-medical: Not on file  Tobacco Use  . Smoking status: Never Smoker  . Smokeless tobacco: Never Used  Substance and Sexual Activity  . Alcohol use: No  . Drug use: No  . Sexual activity: Yes    Partners: Male    Birth control/protection: None  Lifestyle  . Physical activity:    Days per week: Not on file    Minutes per session: Not on file  . Stress: Not on file  Relationships  . Social connections:    Talks on phone: Not on file    Gets together: Not on file    Attends religious service: Not on file    Active member of club or organization: Not on file    Attends meetings of clubs or organizations: Not on file    Relationship status: Not on file  . Intimate partner violence:    Fear of current or ex partner: Not on file    Emotionally abused: Not on file    Physically abused: Not on file    Forced sexual activity: Not on file  Other Topics Concern  .  Not on file  Social History Narrative  . Not on file    Family History: The patient's family history includes Breast cancer in her maternal grandmother; Cancer in her maternal grandfather; Diabetes in her father; Heart disease in her maternal grandfather; Kidney disease in her father; Thyroid disease in her mother.    ROS:  Please see the history of present illness.  All other ROS reviewed and negative.     Physical Exam/Data:   Vitals:   12/09/18 1100 12/09/18 1125  SpO2:  100%  Weight: 65.8 kg   Height: 5' 7.5" (1.715 m)    No intake or output data in the 24 hours ending 12/09/18 1127 Last 3 Weights 12/09/2018 05/08/2016 09/07/2014  Weight (lbs) 145 lb 135 lb 147 lb  Weight (kg) 65.772 kg 61.236 kg 66.679 kg     Body mass index is 22.38 kg/m.  General:  Well nourished, well developed, in no  acute distress HEENT: normal Lymph: no adenopathy Neck: no JVD Endocrine:  No thryomegaly Vascular: No carotid bruits; FA pulses 2+ bilaterally without bruits  Cardiac:  normal S1, S2; RRR; no murmur  Lungs:  clear to auscultation bilaterally, no wheezing, rhonchi or rales  Abd: soft, nontender, no hepatomegaly  Ext: no edema Musculoskeletal:  No deformities, BUE and BLE strength normal and equal Skin: warm and dry  Neuro:  CNs 2-12 intact, no focal abnormalities noted Psych:  Normal affect    EKG:  The ECG that was done today was personally reviewed and demonstrates> Sr at rate of 90 bpm, ST elevation lead V3 to V5  Relevant CV Studies: As above  Laboratory Data:  Radiology/Studies:  No results found.  Assessment and Plan:   1. STEMI - Pending cath. R/o dissection in young healthy female.   Severity of Illness: The appropriate patient status for this patient is INPATIENT. Inpatient status is judged to be reasonable and necessary in order to provide the required intensity of service to ensure the patient's safety. The patient's presenting symptoms, physical exam findings, and initial radiographic and laboratory data in the context of their chronic comorbidities is felt to place them at high risk for further clinical deterioration. Furthermore, it is not anticipated that the patient will be medically stable for discharge from the hospital within 2 midnights of admission. The following factors support the patient status of inpatient.   " The patient's presenting symptoms include Chest pressure radiating to Left arm. " The worrisome physical exam findings include none " The initial radiographic and laboratory data are worrisome because of STEMI " The chronic co-morbidities include None   * I certify that at the point of admission it is my clinical judgment that the patient will require inpatient hospital care spanning beyond 2 midnights from the point of admission due to high  intensity of service, high risk for further deterioration and high frequency of surveillance required.*    For questions or updates, please contact [facility] Please consult [email or web address] for contact info under     SignedManson Passey, PA  12/09/2018 11:27 AM   I have seen and examined the patient and agree with the findings and plan, as documented in the PA/NP's note, with the following additions/changes.  Ms. Apley is a 38 year old woman with no significant past medical history, presenting with acute onset of chest pain that began around 10 this morning while at rest.  She describes severe chest pressure rating to the left arm.  She summoned EMS and was found to  have anterior ST segment elevation.  She continued to have severe chest pain on arrival to the ED and was taken for emergent left heart catheterization.  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  Flow was able to be reestablished in the distal LAD with balloon dilation, though the apical segment of the vessel remained occluded.  Chest pain improved but has continued (5/10 in intensity).  During the catheterization, she also developed shortness of breath and rash concerning for contrast reaction.  The symptoms resolved with IV diphenhydramine and methylprednisolone.  Physical exam is notable for tachycardic but regular rhythm without murmurs.  Lungs are clear anteriorly.  There is no lower extremity edema.  Right femoral arteriotomy site is clean without hematoma.  Labs are notable for creatinine of 0.7, initial troponin of 0.14, LDL 82, hemoglobin 12.7, and platelets 271.  EMS EKG (personally reviewed) demonstrates sinus tachycardia with anterior ST segment elevation.  Ms. Bartunek presentation is consistent with anterior STEMI secondary to scad.  Unfortunately, flow in the apical LAD could not be restored.  We will continue with IV nitroglycerin for pain control.   Fentanyl or hydromorphone could also be considered for severe pain, as the patient is allergic to morphine.  Hopefully, her pain will gradually improve over the next 12 to 24 hours.  We will obtain an echocardiogram to assess her LVEF, as left ventriculogram was not performed due to concern for contrast allergy.  Patient will complete at least 1 month of dual antiplatelet therapy with aspirin and clopidogrel, after which time we will consider discontinuing the clopidogrel and continuing with aspirin monotherapy in the setting of scad.  Outpatient renal artery Doppler should be considered to exclude FMD.  We will continue with statin therapy.  I have also added metoprolol tartrate 12.5 mg twice daily, to be escalated as tolerated from a blood pressure standpoint.  Patient will need further counseling regarding scad over the next few days, including recommendations to avoid future pregnancy.  Yvonne Kendall, MD [facility] Pager: [phone]

## 2018-12-09 NOTE — Progress Notes (Signed)
Dr. Aron Baba (Cardiology) notified continuous chest pain 9/10. Order to continue increasing nitro gtt for chest pain & incoming order for increased dose of metoprolol.

## 2018-12-09 NOTE — Progress Notes (Signed)
Notified of critical troponin value of 0.14 from lab. Dr. Okey Dupre is aware.

## 2018-12-09 NOTE — Progress Notes (Signed)
This RN consulted with pharmacist Marcelino Duster and Dr. Okey Dupre regarding administration of Plavix after discontinuing cangrelor. Per verbal order from Dr. Okey Dupre, administer 600 mg of Plavix immediately after discontinuing cangrelor.

## 2018-12-09 NOTE — Progress Notes (Signed)
CRITICAL VALUE ALERT  Critical Value:  Troponin 7.64  Date & Time Notied:  12/09/18 2037  Provider Notified: Dr. Aron Baba Cardiology  Orders Received/Actions taken: Continue to trend troponins.

## 2018-12-09 NOTE — Progress Notes (Signed)
Nitro gtt weaned down & turned off due to decreasing SBP <90. Cardiology paged.

## 2018-12-10 ENCOUNTER — Other Ambulatory Visit: Payer: Self-pay

## 2018-12-10 ENCOUNTER — Inpatient Hospital Stay (HOSPITAL_COMMUNITY): Payer: No Typology Code available for payment source

## 2018-12-10 DIAGNOSIS — I361 Nonrheumatic tricuspid (valve) insufficiency: Secondary | ICD-10-CM

## 2018-12-10 LAB — BASIC METABOLIC PANEL
Anion gap: 10 (ref 5–15)
BUN: 11 mg/dL (ref 6–20)
CO2: 21 mmol/L — ABNORMAL LOW (ref 22–32)
Calcium: 8.2 mg/dL — ABNORMAL LOW (ref 8.9–10.3)
Chloride: 105 mmol/L (ref 98–111)
Creatinine, Ser: 0.7 mg/dL (ref 0.44–1.00)
GFR calc Af Amer: >60 mL/min (ref >60)
Glucose, Bld: 108 mg/dL — ABNORMAL HIGH (ref 70–99)
Potassium: 3.9 mmol/L (ref 3.5–5.1)
Sodium: 136 mmol/L (ref 135–145)

## 2018-12-10 LAB — LIPID PANEL
CHOLESTEROL: 127 mg/dL (ref 0–200)
HDL: 51 mg/dL (ref >40)
LDL Cholesterol: 68 mg/dL (ref 0–99)
Total CHOL/HDL Ratio: 2.5 RATIO
Triglycerides: 42 mg/dL (ref <150)
VLDL: 8 mg/dL (ref 0–40)

## 2018-12-10 LAB — CBC
HCT: 32.1 % — ABNORMAL LOW (ref 36.0–46.0)
Hemoglobin: 10.5 g/dL — ABNORMAL LOW (ref 12.0–15.0)
MCH: 28.2 pg (ref 26.0–34.0)
MCHC: 32.7 g/dL (ref 30.0–36.0)
MCV: 86.1 fL (ref 80.0–100.0)
Platelets: 263 10*3/uL (ref 150–400)
RBC: 3.73 MIL/uL — AB (ref 3.87–5.11)
RDW: 13.1 % (ref 11.5–15.5)
WBC: 9 10*3/uL (ref 4.0–10.5)
nRBC: 0 % (ref 0.0–0.2)

## 2018-12-10 LAB — TROPONIN I
Troponin I: 16.35 ng/mL (ref <0.03)
Troponin I: 11.34 ng/mL (ref <0.03)

## 2018-12-10 LAB — ECHOCARDIOGRAM COMPLETE
Height: 67 in
Weight: 2412.71 oz

## 2018-12-10 MED ORDER — METOPROLOL TARTRATE 5 MG/5ML IV SOLN
INTRAVENOUS | Status: AC
  Administered 2018-12-10: 2.5 mg via INTRAVENOUS
  Filled 2018-12-10: qty 5

## 2018-12-10 MED ORDER — HYDROMORPHONE HCL 1 MG/ML IJ SOLN
0.5000 mg | Freq: Once | INTRAMUSCULAR | Status: AC
  Administered 2018-12-10: 0.5 mg via INTRAVENOUS
  Filled 2018-12-10: qty 0.5

## 2018-12-10 MED ORDER — HYDROMORPHONE HCL 1 MG/ML IJ SOLN
0.5000 mg | INTRAMUSCULAR | Status: DC | PRN
  Administered 2018-12-10 – 2018-12-11 (×7): 0.5 mg via INTRAVENOUS
  Filled 2018-12-10 (×7): qty 0.5

## 2018-12-10 MED ORDER — HEPARIN (PORCINE) 25000 UT/250ML-% IV SOLN
800.0000 [IU]/h | INTRAVENOUS | Status: DC
  Administered 2018-12-10: 800 [IU]/h via INTRAVENOUS
  Filled 2018-12-10: qty 250

## 2018-12-10 MED ORDER — METOPROLOL TARTRATE 5 MG/5ML IV SOLN
2.5000 mg | Freq: Once | INTRAVENOUS | Status: AC
  Administered 2018-12-10: 2.5 mg via INTRAVENOUS

## 2018-12-10 MED ORDER — KETOROLAC TROMETHAMINE 15 MG/ML IJ SOLN
15.0000 mg | Freq: Once | INTRAMUSCULAR | Status: AC
  Administered 2018-12-10: 15 mg via INTRAVENOUS
  Filled 2018-12-10: qty 1

## 2018-12-10 NOTE — Progress Notes (Signed)
Cardiology Progress Note:   Patient ID: Stephanie Wagner MRN: [id number]; DOB: 06-Sep-1981   Admission date: 12/09/2018  Primary Care Provider: Patient, No Pcp Per Primary Cardiologist: New  Chief Complaint:  STEMI  Patient Profile:   Stephanie Wagner is a 38 y.o. female with h/o polycystic kidney/liver disease presented with code STEMI by EMS. 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 Underwent POBA to dLAD  Interval history    S/p POBA of LAD last night for SCAD. Trop 16.35 -> 11.34   Still having CP. On IV NTG at 30 and getting dilaudid    Medications   . aspirin  81 mg Oral Daily  . atorvastatin  40 mg Oral q1800  . clopidogrel  75 mg Oral Q breakfast  . metoprolol tartrate  37.5 mg Oral BID  . sodium chloride flush  3 mL Intravenous Q12H    Physical Exam/Data:   Vitals:   12/10/18 1000 12/10/18 1100 12/10/18 1105 12/10/18 1200  BP: 92/67 (!) 112/97  96/77  Pulse: 73 87  80  Resp: 13 16  14   Temp:   97.9 F (36.6 C)   TempSrc:   Oral   SpO2: 98% 100%  99%  Weight:      Height:        Intake/Output Summary (Last 24 hours) at 12/10/2018 1236 Last data filed at 12/10/2018 1200 Gross per 24 hour  Intake 2197.52 ml  Output 1000 ml  Net 1197.52 ml   Last 3 Weights 12/09/2018 12/09/2018 05/08/2016  Weight (lbs) 150 lb 12.7 oz 145 lb 135 lb  Weight (kg) 68.4 kg 65.772 kg 61.236 kg     Body mass index is 23.62 kg/m.  General:  Fatigued appearing. No resp difficulty HEENT: normal Neck: supple. no JVD. Carotids 2+ bilat; no bruits. No lymphadenopathy or thryomegaly appreciated. Cor: PMI nondisplaced. Regular rate & rhythm. No rubs, gallops or murmurs. Lungs: clear Abdomen: soft, nontender, nondistended. No hepatosplenomegaly. No bruits or masses. Good bowel sounds. Extremities: no cyanosis, clubbing, rash, edema Neuro: alert & orientedx3, cranial nerves grossly intact. moves all 4 extremities w/o difficulty. Affect pleasant   EKG:  NSR 76 anterolateral and  inferior deep TWI   Relevant CV Studies: Diagnostic  Dominance: Right    Intervention      Laboratory Data:  Results for orders placed or performed during the hospital encounter of 12/09/18 (from the past 24 hour(s))  POCT Activated clotting time     Status: None   Collection Time: 12/09/18  2:41 PM  Result Value Ref Range   Activated Clotting Time 142 seconds  MRSA PCR Screening     Status: None   Collection Time: 12/09/18  4:20 PM  Result Value Ref Range   MRSA by PCR NEGATIVE NEGATIVE  Troponin I - Now Then Q6H     Status: Abnormal   Collection Time: 12/09/18  6:06 PM  Result Value Ref Range   Troponin I 7.64 (HH) <0.03 ng/mL  Pregnancy, urine     Status: None   Collection Time: 12/09/18  6:44 PM  Result Value Ref Range   Preg Test, Ur NEGATIVE NEGATIVE  Troponin I - Now Then Q6H     Status: Abnormal   Collection Time: 12/09/18 10:02 PM  Result Value Ref Range   Troponin I 15.35 (HH) <0.03 ng/mL  Basic metabolic panel     Status: Abnormal   Collection Time: 12/09/18 10:02 PM  Result Value Ref Range   Sodium 135 135 - 145  mmol/L   Potassium 3.8 3.5 - 5.1 mmol/L   Chloride 104 98 - 111 mmol/L   CO2 21 (L) 22 - 32 mmol/L   Glucose, Bld 184 (H) 70 - 99 mg/dL   BUN 11 6 - 20 mg/dL   Creatinine, Ser 6.96 0.44 - 1.00 mg/dL   Calcium 8.8 (L) 8.9 - 10.3 mg/dL   GFR calc non Af Amer >60 >60 mL/min   GFR calc Af Amer >60 >60 mL/min   Anion gap 10 5 - 15  Troponin I - Now Then Q6H     Status: Abnormal   Collection Time: 12/10/18  5:16 AM  Result Value Ref Range   Troponin I 16.35 (HH) <0.03 ng/mL  Basic metabolic panel     Status: Abnormal   Collection Time: 12/10/18  5:16 AM  Result Value Ref Range   Sodium 136 135 - 145 mmol/L   Potassium 3.9 3.5 - 5.1 mmol/L   Chloride 105 98 - 111 mmol/L   CO2 21 (L) 22 - 32 mmol/L   Glucose, Bld 108 (H) 70 - 99 mg/dL   BUN 11 6 - 20 mg/dL   Creatinine, Ser 7.89 0.44 - 1.00 mg/dL   Calcium 8.2 (L) 8.9 - 10.3 mg/dL   GFR calc  non Af Amer >60 >60 mL/min   GFR calc Af Amer >60 >60 mL/min   Anion gap 10 5 - 15  CBC     Status: Abnormal   Collection Time: 12/10/18  5:16 AM  Result Value Ref Range   WBC 9.0 4.0 - 10.5 K/uL   RBC 3.73 (L) 3.87 - 5.11 MIL/uL   Hemoglobin 10.5 (L) 12.0 - 15.0 g/dL   HCT 38.1 (L) 01.7 - 51.0 %   MCV 86.1 80.0 - 100.0 fL   MCH 28.2 26.0 - 34.0 pg   MCHC 32.7 30.0 - 36.0 g/dL   RDW 25.8 52.7 - 78.2 %   Platelets 263 150 - 400 K/uL   nRBC 0.0 0.0 - 0.2 %  Lipid panel     Status: None   Collection Time: 12/10/18  5:16 AM  Result Value Ref Range   Cholesterol 127 0 - 200 mg/dL   Triglycerides 42 <423 mg/dL   HDL 51 >53 mg/dL   Total CHOL/HDL Ratio 2.5 RATIO   VLDL 8 0 - 40 mg/dL   LDL Cholesterol 68 0 - 99 mg/dL  Troponin I - Now Then Q6H     Status: Abnormal   Collection Time: 12/10/18 10:51 AM  Result Value Ref Range   Troponin I 11.34 (HH) <0.03 ng/mL    Patient Profile:   Stephanie Wagner is a 38 y.o. female with h/o polycystic kidney/liver disease presented with code STEMI by EMS on 2/21. 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 Underwent POBA to dLAD   Assessment and Plan:   1. CAD with anterior STEMI - Peak trop 16.35 - Cath 2/21: Found to have distal LAD occlusion due to SCAD. Underwent POBA to dLAD - Still having CP despite IV NTG. Responds better to dilaudid - Continue DAPT, statin and b-blocker. Will increase b-blocker and wean NTG - CR to see.  - echo ordered - keep in ICU. Trend troponins  - long talk with her and her mother about pathophysiology and natural history of SCAD  2.  h/o polycystic kidney/liver disease - creatinine normal   Signed, Arvilla Meres, MD  12/10/2018 12:36 PM

## 2018-12-10 NOTE — Progress Notes (Signed)
  Echocardiogram 2D Echocardiogram has been performed.  Janalyn Harder 12/10/2018, 2:41 PM

## 2018-12-10 NOTE — Progress Notes (Signed)
Heparin gtt stopped per Cardiology, Dr. Aron Baba. Order for 12 lead EKG now. Pt reports 4/10 chest pain with improvement due to hydromorphone. SBP > 100, HR 78-80s with nitro gtt at 71mcg/min. Additional orders received from Dr. Aron Baba: Hydromorphone IV PRN for pain, maintain nitro gtt at 4mcg/min, hold on further nitro gtt weaning at this time.

## 2018-12-10 NOTE — Progress Notes (Signed)
Pt reports 8/10 chest pain on nitro gtt. SBP 120s HR 90s. Pt also reports headache with a history of migraines. Pt refuses tylenol reporting "it doesn't help". Troponin 15.35. Cardiology paged for notification.

## 2018-12-10 NOTE — Progress Notes (Signed)
ANTICOAGULATION CONSULT NOTE - Initial Consult  Pharmacy Consult for heparin Indication: STEMI, now s/p cardiac cath w/ cont'd CP and rising troponins  Allergies  Allergen Reactions  . Contrast Media [Iodinated Diagnostic Agents] Shortness Of Breath    Dyspnea and rash during heart catheterization  . Penicillins Nausea And Vomiting  . Claritin [Loratadine] Palpitations    Just during pregnancy  . Morphine And Related Nausea And Vomiting    Patient Measurements: Height: 5\' 7"  (170.2 cm) Weight: 150 lb 12.7 oz (68.4 kg) IBW/kg (Calculated) : 61.6  Vital Signs: Temp: 98.4 F (36.9 C) (02/22 0000) Temp Source: Oral (02/22 0000) BP: 120/87 (02/22 0100) Pulse Rate: 86 (02/22 0100)  Labs: Recent Labs    12/09/18 1135 12/09/18 1806 12/09/18 2202  HGB 12.7  --   --   HCT 37.9  --   --   PLT 271  --   --   APTT 31  --   --   LABPROT 13.8  --   --   INR 1.07  --   --   CREATININE 0.69  --  0.86  TROPONINI 0.14* 7.64* 15.35*    Estimated Creatinine Clearance: 85.4 mL/min (by C-G formula based on SCr of 0.86 mg/dL).   Medical History: Past Medical History:  Diagnosis Date  . Back pain   . Chronic headaches   . Environmental allergies    animals, dander  . Migraine   . Neck pain   . Seasonal allergies   . Tachycardia     Medications:  Medications Prior to Admission  Medication Sig Dispense Refill Last Dose  . norethindrone-ethinyl estradiol (MICROGESTIN,JUNEL,LOESTRIN) 1-20 MG-MCG tablet TK 1 T PO QD  0    Scheduled:  . aspirin  81 mg Oral Daily  . atorvastatin  40 mg Oral q1800  . clopidogrel  75 mg Oral Q breakfast  . metoprolol tartrate  37.5 mg Oral BID  . sodium chloride flush  3 mL Intravenous Q12H   Infusions:  . sodium chloride    . nitroGLYCERIN 50 mcg/min (12/10/18 0100)    Assessment: 37yo female [facility] to [facility] as code STEMI and sent emergently to cath lab >> revascularization of distal LAD; pt rec'd heparin and cangrelor during cath  followed by a loading dose of clopidogrel; pt continues to c/o 8/10 CP overnight despite NTG gtt at 32mcg/min, PRN ketorolac, and a dose of hydromorphone; troponins have trended up 0.14>7.64>15.35; pt now to start heparin.  Goal of Therapy:  Heparin level 0.3-0.7 units/ml Monitor platelets by anticoagulation protocol: Yes   Plan:  Will start heparin gtt cautiously at 800 units/hr and monitor heparin levels and CBC.  Vernard Gambles, PharmD, BCPS  12/10/2018,1:21 AM

## 2018-12-10 NOTE — Plan of Care (Signed)

## 2018-12-10 NOTE — Progress Notes (Signed)
1430 Pt getting ECHO now. Will continue to follow pt. Luetta Nutting RN BSN 12/10/2018 2:57 PM

## 2018-12-10 NOTE — Progress Notes (Signed)
Dr. Aron Baba updated on pt status. Nitro gtt at 29mcg/min with chest pain 8/10. Incoming orders: toradol 15mg  IV once, dilaudid 0.5mg  IV once. Cardiology may start heparin gtt. Assessment ongoing.

## 2018-12-11 LAB — BASIC METABOLIC PANEL
Anion gap: 6 (ref 5–15)
BUN: 11 mg/dL (ref 6–20)
CO2: 26 mmol/L (ref 22–32)
Calcium: 8.7 mg/dL — ABNORMAL LOW (ref 8.9–10.3)
Chloride: 104 mmol/L (ref 98–111)
Creatinine, Ser: 0.77 mg/dL (ref 0.44–1.00)
GFR calc Af Amer: >60 mL/min (ref >60)
GFR calc non Af Amer: >60 mL/min (ref >60)
GLUCOSE: 98 mg/dL (ref 70–99)
Potassium: 4.1 mmol/L (ref 3.5–5.1)
Sodium: 136 mmol/L (ref 135–145)

## 2018-12-11 LAB — CBC
HEMATOCRIT: 34.9 % — AB (ref 36.0–46.0)
Hemoglobin: 11 g/dL — ABNORMAL LOW (ref 12.0–15.0)
MCH: 27.7 pg (ref 26.0–34.0)
MCHC: 31.5 g/dL (ref 30.0–36.0)
MCV: 87.9 fL (ref 80.0–100.0)
Platelets: 242 10*3/uL (ref 150–400)
RBC: 3.97 MIL/uL (ref 3.87–5.11)
RDW: 13.2 % (ref 11.5–15.5)
WBC: 6.6 10*3/uL (ref 4.0–10.5)
nRBC: 0 % (ref 0.0–0.2)

## 2018-12-11 LAB — TROPONIN I: Troponin I: 6.74 ng/mL (ref <0.03)

## 2018-12-11 MED ORDER — IBUPROFEN 200 MG PO TABS: 600.0000 mg | ORAL_TABLET | Freq: Three times a day (TID) | ORAL | Status: DC | PRN

## 2018-12-11 MED ORDER — TRAMADOL HCL 50 MG PO TABS
50.0000 mg | ORAL_TABLET | Freq: Four times a day (QID) | ORAL | Status: DC | PRN
  Administered 2018-12-11 – 2018-12-12 (×4): 50 mg via ORAL
  Filled 2018-12-11 (×4): qty 1

## 2018-12-11 MED ORDER — IBUPROFEN 600 MG PO TABS
600.0000 mg | ORAL_TABLET | Freq: Three times a day (TID) | ORAL | Status: DC | PRN
  Administered 2018-12-12 – 2018-12-13 (×5): 600 mg via ORAL
  Filled 2018-12-11 (×2): qty 1
  Filled 2018-12-11 (×3): qty 3
  Filled 2018-12-11: qty 1

## 2018-12-11 MED ORDER — IBUPROFEN 200 MG PO TABS
600.0000 mg | ORAL_TABLET | Freq: Once | ORAL | Status: AC
  Administered 2018-12-11: 600 mg via ORAL
  Filled 2018-12-11: qty 3

## 2018-12-11 NOTE — Plan of Care (Signed)
  Problem: Education: Goal: Knowledge of General Education information will improve Description Including pain rating scale, medication(s)/side effects and non-pharmacologic comfort measures Outcome: Progressing   Problem: Health Behavior/Discharge Planning: Goal: Ability to manage health-related needs will improve Outcome: Progressing   Problem: Clinical Measurements: Goal: Ability to maintain clinical measurements within normal limits will improve Outcome: Progressing Goal: Will remain free from infection Outcome: Progressing Goal: Diagnostic test results will improve Outcome: Progressing Goal: Cardiovascular complication will be avoided Outcome: Progressing   Problem: Activity: Goal: Risk for activity intolerance will decrease Outcome: Progressing   Problem: Nutrition: Goal: Adequate nutrition will be maintained Outcome: Progressing   Problem: Coping: Goal: Level of anxiety will decrease Outcome: Progressing   Problem: Elimination: Goal: Will not experience complications related to bowel motility Outcome: Progressing Goal: Will not experience complications related to urinary retention Outcome: Progressing   Problem: Pain Managment: Goal: General experience of comfort will improve Outcome: Progressing   Problem: Safety: Goal: Ability to remain free from injury will improve Outcome: Progressing   Problem: Skin Integrity: Goal: Risk for impaired skin integrity will decrease Outcome: Progressing   Problem: Cardiovascular: Goal: Ability to achieve and maintain adequate cardiovascular perfusion will improve Outcome: Progressing Goal: Vascular access site(s) Level 0-1 will be maintained Outcome: Progressing   Problem: Education: Goal: Understanding of medication regimen will improve Outcome: Progressing   Problem: Activity: Goal: Ability to tolerate increased activity will improve Outcome: Progressing   Problem: Cardiac: Goal: Ability to achieve and  maintain adequate cardiopulmonary perfusion will improve Outcome: Progressing Goal: Vascular access site(s) Level 0-1 will be maintained Outcome: Progressing   Problem: Health Behavior/Discharge Planning: Goal: Ability to safely manage health-related needs after discharge will improve Outcome: Progressing

## 2018-12-11 NOTE — Progress Notes (Addendum)
Cardiology Progress Note:   Patient ID: Stephanie Wagner MRN: [id number]; DOB: December 17, 1980   Admission date: 12/09/2018  Primary Care Provider: Patient, No Pcp Per Primary Cardiologist: New  Chief Complaint:  STEMI  Patient Profile:   Stephanie Wagner is a 38 y.o. female with h/o polycystic kidney/liver disease presented with code STEMI by EMS. 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 Underwent POBA to dLAD  Interval history    S/p POBA of LAD 2/21 for SCAD. Trop 16.35 -> 11.34   CP improved but she states she gets anxious and it recurs. Walking hall. Off IV NTG. Still getting occasional dilaudid bolus. No SOB   Medications   . aspirin  81 mg Oral Daily  . atorvastatin  40 mg Oral q1800  . clopidogrel  75 mg Oral Q breakfast  . metoprolol tartrate  37.5 mg Oral BID  . sodium chloride flush  3 mL Intravenous Q12H    Physical Exam/Data:   Vitals:   12/11/18 0700 12/11/18 0726 12/11/18 0800 12/11/18 0900  BP: 107/85  110/83 105/80  Pulse: 81  82 85  Resp: 18  13 14   Temp:  98.1 F (36.7 C)    TempSrc:  Oral    SpO2: 98%  98% 97%  Weight:      Height:        Intake/Output Summary (Last 24 hours) at 12/11/2018 0926 Last data filed at 12/11/2018 0900 Gross per 24 hour  Intake 887.71 ml  Output -  Net 887.71 ml   Last 3 Weights 12/09/2018 12/09/2018 05/08/2016  Weight (lbs) 150 lb 12.7 oz 145 lb 135 lb  Weight (kg) 68.4 kg 65.772 kg 61.236 kg     Body mass index is 23.62 kg/m.  General:  Fatigued appearing. No resp difficulty HEENT: normal Neck: supple. no JVD. Carotids 2+ bilat; no bruits. No lymphadenopathy or thryomegaly appreciated. Cor: PMI nondisplaced. Regular rate & rhythm. No rubs, gallops or murmurs. Lungs: clear Abdomen: soft, nontender, nondistended. No hepatosplenomegaly. No bruits or masses. Good bowel sounds. Extremities: no cyanosis, clubbing, rash, edema Neuro: alert & orientedx3, cranial nerves grossly intact. moves all 4 extremities w/o  difficulty. Affect pleasant   EKG:  NSR 76 anterolateral and inferior deep TWI   Relevant CV Studies: Diagnostic  Dominance: Right    Intervention      Laboratory Data:  Results for orders placed or performed during the hospital encounter of 12/09/18 (from the past 24 hour(s))  Troponin I - Now Then Q6H     Status: Abnormal   Collection Time: 12/10/18 10:51 AM  Result Value Ref Range   Troponin I 11.34 (HH) <0.03 ng/mL    Patient Profile:   Stephanie Wagner is a 38 y.o. female with h/o polycystic kidney/liver disease presented with code STEMI by EMS on 2/21. 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 Underwent POBA to dLAD   Assessment and Plan:   1. CAD with anterior STEMI - Peak trop 16.35 -> 11.34 (2/22). No labs today. Will recheck.  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 Underwent POBA to dLAD - CP improving but comes back when she is anxious. No evidence of pericarditis on exam. Discussed start low-dose Celexa 10 but she wants to think about - Continue DAPT, statin and b-blocker. - CR has seen  - echo 2/22 EF 55-60%  - Stop dilaudid. Prn Tramadol . - Ok to go to floor  2.  h/o polycystic kidney/liver disease -  creatinine normal   Signed, Arvilla Meres, MD  12/11/2018 9:26 AM

## 2018-12-11 NOTE — Plan of Care (Signed)
  Problem: Education: Goal: Knowledge of General Education information will improve Description Including pain rating scale, medication(s)/side effects and non-pharmacologic comfort measures Outcome: Progressing   Problem: Health Behavior/Discharge Planning: Goal: Ability to manage health-related needs will improve Outcome: Progressing   Problem: Clinical Measurements: Goal: Ability to maintain clinical measurements within normal limits will improve Outcome: Progressing Goal: Will remain free from infection Outcome: Progressing Goal: Diagnostic test results will improve Outcome: Progressing Goal: Respiratory complications will improve Outcome: Progressing Goal: Cardiovascular complication will be avoided Outcome: Progressing   Problem: Activity: Goal: Risk for activity intolerance will decrease Outcome: Progressing   Problem: Nutrition: Goal: Adequate nutrition will be maintained Outcome: Progressing   Problem: Coping: Goal: Level of anxiety will decrease Outcome: Progressing   Problem: Elimination: Goal: Will not experience complications related to bowel motility Outcome: Progressing Goal: Will not experience complications related to urinary retention Outcome: Progressing   Problem: Pain Managment: Goal: General experience of comfort will improve Outcome: Progressing   Problem: Safety: Goal: Ability to remain free from injury will improve Outcome: Progressing   Problem: Skin Integrity: Goal: Risk for impaired skin integrity will decrease Outcome: Progressing   Problem: Education: Goal: Individualized Educational Video(s) Outcome: Progressing   Problem: Cardiovascular: Goal: Ability to achieve and maintain adequate cardiovascular perfusion will improve Outcome: Progressing Goal: Vascular access site(s) Level 0-1 will be maintained Outcome: Progressing   Problem: Education: Goal: Understanding of medication regimen will improve Outcome: Progressing    Problem: Activity: Goal: Ability to tolerate increased activity will improve Outcome: Progressing   Problem: Cardiac: Goal: Ability to achieve and maintain adequate cardiopulmonary perfusion will improve Outcome: Progressing Goal: Vascular access site(s) Level 0-1 will be maintained Outcome: Progressing   Problem: Health Behavior/Discharge Planning: Goal: Ability to safely manage health-related needs after discharge will improve Outcome: Progressing   

## 2018-12-11 NOTE — Progress Notes (Signed)
Patient complained of vague chest discomfort at approximately 1700.  Her mother stated, "I think there is something happening here..... I think the pain is worse than when she first came in here........ I want something done".  Patient stated pain was a "burning" sensation, then stated it was an "achiness", then stated it was like a "pressure", when asked to explain.  She stated to me that she was afraid that the same condition was occurring again.  Her mother stated, "You see how scared she is....... she needs someone to tell her that it won't happen again".  Provided emotional support and education to patient (and to mother, who didn't seem to listen to initial explanations and repeatedly asked the same questions multiple times).  Called on-call cardiologist after performing EKG and explained situation.  MD was Dr. Liane Comber.  Patient wanted to have Dilaudid ordered.  This was conveyed to MD.  NO written for Motrin, 600 mg now and prn q 6 h.  Aroma therapy with lavender scent also utilized.  Patient resting at this time in no acute distress.  V/S were stable throughout.

## 2018-12-12 ENCOUNTER — Encounter (HOSPITAL_COMMUNITY): Payer: Self-pay | Admitting: Internal Medicine

## 2018-12-12 LAB — BASIC METABOLIC PANEL
Anion gap: 8 (ref 5–15)
BUN: 14 mg/dL (ref 6–20)
CALCIUM: 8.8 mg/dL — AB (ref 8.9–10.3)
CO2: 27 mmol/L (ref 22–32)
CREATININE: 0.84 mg/dL (ref 0.44–1.00)
Chloride: 101 mmol/L (ref 98–111)
GFR calc Af Amer: >60 mL/min (ref >60)
GFR calc non Af Amer: >60 mL/min (ref >60)
Glucose, Bld: 93 mg/dL (ref 70–99)
Potassium: 4.2 mmol/L (ref 3.5–5.1)
Sodium: 136 mmol/L (ref 135–145)

## 2018-12-12 LAB — CBC
HCT: 36.3 % (ref 36.0–46.0)
Hemoglobin: 11.5 g/dL — ABNORMAL LOW (ref 12.0–15.0)
MCH: 27.9 pg (ref 26.0–34.0)
MCHC: 31.7 g/dL (ref 30.0–36.0)
MCV: 88.1 fL (ref 80.0–100.0)
Platelets: 249 10*3/uL (ref 150–400)
RBC: 4.12 MIL/uL (ref 3.87–5.11)
RDW: 13.2 % (ref 11.5–15.5)
WBC: 6.3 10*3/uL (ref 4.0–10.5)
nRBC: 0 % (ref 0.0–0.2)

## 2018-12-12 LAB — POCT ACTIVATED CLOTTING TIME
Activated Clotting Time: 202 seconds
Activated Clotting Time: 230 seconds

## 2018-12-12 MED ORDER — CLOTRIMAZOLE 1 % EX CREA
TOPICAL_CREAM | Freq: Two times a day (BID) | CUTANEOUS | Status: DC
  Administered 2018-12-12 – 2018-12-14 (×4): via TOPICAL
  Filled 2018-12-12: qty 15

## 2018-12-12 MED FILL — Verapamil HCl IV Soln 2.5 MG/ML: INTRAVENOUS | Qty: 2 | Status: AC

## 2018-12-12 NOTE — Progress Notes (Signed)
Pt arrived unit safely, alert and oriented with no new concerns, pt oriented to room and education provided on how to reach and call for assistance. Bed in lowest position.

## 2018-12-12 NOTE — Progress Notes (Signed)
Progress Note  Patient Name: Stephanie Wagner Date of Encounter: 12/12/2018  Primary Cardiologist: No primary care provider on file. Dr. Okey Dupre  Subjective   Chest pain significantly improved since the MI.  Mild residual pain, worse with deep breathing.  THis makes her somewhat anxious.   Inpatient Medications    Scheduled Meds: . aspirin  81 mg Oral Daily  . atorvastatin  40 mg Oral q1800  . clopidogrel  75 mg Oral Q breakfast  . clotrimazole   Topical BID  . metoprolol tartrate  37.5 mg Oral BID  . sodium chloride flush  3 mL Intravenous Q12H   Continuous Infusions: . sodium chloride     PRN Meds: sodium chloride, acetaminophen, ibuprofen, ondansetron (ZOFRAN) IV, sodium chloride flush, traMADol   Vital Signs    Vitals:   12/12/18 1400 12/12/18 1509 12/12/18 1535 12/12/18 1600  BP: 104/81  112/81 110/83  Pulse: 86  77 75  Resp: 15  12 13   Temp:  98.3 F (36.8 C)    TempSrc:  Oral    SpO2: 97%  100% 100%  Weight:      Height:        Intake/Output Summary (Last 24 hours) at 12/12/2018 1644 Last data filed at 12/12/2018 0000 Gross per 24 hour  Intake 240 ml  Output -  Net 240 ml   Last 3 Weights 12/09/2018 12/09/2018 05/08/2016  Weight (lbs) 150 lb 12.7 oz 145 lb 135 lb  Weight (kg) 68.4 kg 65.772 kg 61.236 kg      Telemetry    NSR - Personally Reviewed  ECG      Physical Exam   GEN: No acute distress.   Neck: No JVD Cardiac: RRR, no murmurs, rubs, or gallops.  Respiratory: Clear to auscultation bilaterally. GI: Soft, nontender, non-distended  MS: No edema; No deformity. Neuro:  Nonfocal  Psych: Normal affect  Skin: area of circular rash on the right wrist  Labs    Chemistry Recent Labs  Lab 12/09/18 1135  12/10/18 0516 12/11/18 1044 12/12/18 0606  NA 136   < > 136 136 136  K 3.6   < > 3.9 4.1 4.2  CL 105   < > 105 104 101  CO2 22   < > 21* 26 27  GLUCOSE 131*   < > 108* 98 93  BUN 10   < > 11 11 14   CREATININE 0.69   < > 0.70 0.77 0.84   CALCIUM 9.4   < > 8.2* 8.7* 8.8*  PROT 6.5  --   --   --   --   ALBUMIN 3.8  --   --   --   --   AST 33  --   --   --   --   ALT 28  --   --   --   --   ALKPHOS 67  --   --   --   --   BILITOT 0.7  --   --   --   --   GFRNONAA >60   < > >60 >60 >60  GFRAA >60   < > >60 >60 >60  ANIONGAP 9   < > 10 6 8    < > = values in this interval not displayed.     Hematology Recent Labs  Lab 12/10/18 0516 12/11/18 1044 12/12/18 0606  WBC 9.0 6.6 6.3  RBC 3.73* 3.97 4.12  HGB 10.5* 11.0* 11.5*  HCT 32.1* 34.9* 36.3  MCV 86.1 87.9 88.1  MCH 28.2 27.7 27.9  MCHC 32.7 31.5 31.7  RDW 13.1 13.2 13.2  PLT 263 242 249    Cardiac Enzymes Recent Labs  Lab 12/09/18 2202 12/10/18 0516 12/10/18 1051 12/11/18 1044  TROPONINI 15.35* 16.35* 11.34* 6.74*   No results for input(s): TROPIPOC in the last 168 hours.   BNPNo results for input(s): BNP, PROBNP in the last 168 hours.   DDimer No results for input(s): DDIMER in the last 168 hours.   Radiology    No results found.  Cardiac Studies   Cath films reviewed  Patient Profile     38 y.o. female with SCAD affecting distal LAD.  Mild residual pain, worse with deep breathing.  THis makes her somewhat anxious.   Assessment & Plan    1) Anterior MI related to SCAD.  COntinue with BP control.  DAPT for 1 month.  WOuld avoid pregnancy in the future.    2) Mild pleuritic pain.  3) Plan for repeat echo to check from thrombus at the apex, based on the echo report from yesterday.    Will have internal medicine evaluate rash and treat.   OK to transfer to tele today.    For questions or updates, please contact [facility] Please consult [email or web address] for contact info under        Signed, Lance Muss, MD  12/12/2018, 4:44 PM

## 2018-12-12 NOTE — Plan of Care (Signed)
  Problem: Education: Goal: Knowledge of General Education information will improve Description Including pain rating scale, medication(s)/side effects and non-pharmacologic comfort measures Outcome: Progressing   Problem: Health Behavior/Discharge Planning: Goal: Ability to manage health-related needs will improve Outcome: Progressing   Problem: Clinical Measurements: Goal: Ability to maintain clinical measurements within normal limits will improve Outcome: Progressing Goal: Will remain free from infection Outcome: Progressing Goal: Diagnostic test results will improve Outcome: Progressing Goal: Respiratory complications will improve Outcome: Progressing Goal: Cardiovascular complication will be avoided Outcome: Progressing   Problem: Activity: Goal: Risk for activity intolerance will decrease Outcome: Progressing   Problem: Nutrition: Goal: Adequate nutrition will be maintained Outcome: Progressing   Problem: Coping: Goal: Level of anxiety will decrease Outcome: Progressing   Problem: Elimination: Goal: Will not experience complications related to bowel motility Outcome: Progressing Goal: Will not experience complications related to urinary retention Outcome: Progressing   Problem: Pain Managment: Goal: General experience of comfort will improve Outcome: Progressing   Problem: Safety: Goal: Ability to remain free from injury will improve Outcome: Progressing   Problem: Skin Integrity: Goal: Risk for impaired skin integrity will decrease Outcome: Progressing   Problem: Education: Goal: Individualized Educational Video(s) Outcome: Progressing   Problem: Cardiovascular: Goal: Ability to achieve and maintain adequate cardiovascular perfusion will improve Outcome: Progressing Goal: Vascular access site(s) Level 0-1 will be maintained Outcome: Progressing   Problem: Education: Goal: Understanding of medication regimen will improve Outcome: Progressing    Problem: Activity: Goal: Ability to tolerate increased activity will improve Outcome: Progressing   Problem: Cardiac: Goal: Ability to achieve and maintain adequate cardiopulmonary perfusion will improve Outcome: Progressing Goal: Vascular access site(s) Level 0-1 will be maintained Outcome: Progressing   Problem: Health Behavior/Discharge Planning: Goal: Ability to safely manage health-related needs after discharge will improve Outcome: Progressing

## 2018-12-12 NOTE — Progress Notes (Signed)
CARDIAC REHAB PHASE I   PRE:  Rate/Rhythm: 94 SR  BP:  Supine: 105/80  Sitting:   Standing:    SaO2: 100%RA  MODE:  Ambulation: 370 ft   POST:  Rate/Rhythm: 104 ST  BP:  Supine: 119/90  Sitting:   Standing:    SaO2: 100%RA 0955-1040 Assisted pt to bathroom before and after walk. Pt walked 370 ft on RA with hand held asst. Pt stopped once and had to sit down to rest. Pt stated her chest discomfort was about 5 on pain scale. Pt walks with slow pace and small steps. Encouraged pursed lip breathing when walking. Back to bed after walk with mother and husband in room. Reviewed MI restrictions, NTG use, gave heart healthy diet and ex ed for slow gradual progression. Discussed CRP 2 and put in referral for [facility].  Pt stated a little more pressure when resting after walk.  Will follow up tomorrow.   Luetta Nutting, RN BSN  12/12/2018 10:36 AM

## 2018-12-13 ENCOUNTER — Inpatient Hospital Stay (HOSPITAL_COMMUNITY): Payer: No Typology Code available for payment source

## 2018-12-13 ENCOUNTER — Encounter (HOSPITAL_COMMUNITY): Payer: Self-pay

## 2018-12-13 DIAGNOSIS — I2102 ST elevation (STEMI) myocardial infarction involving left anterior descending coronary artery: Secondary | ICD-10-CM

## 2018-12-13 LAB — ECHOCARDIOGRAM LIMITED
Height: 68 in
Weight: 2296.31 oz

## 2018-12-13 MED ORDER — PERFLUTREN LIPID MICROSPHERE
1.0000 mL | INTRAVENOUS | Status: AC | PRN
  Administered 2018-12-13: 2 mL via INTRAVENOUS
  Filled 2018-12-13: qty 10

## 2018-12-13 MED ORDER — METOPROLOL TARTRATE 50 MG PO TABS
50.0000 mg | ORAL_TABLET | Freq: Two times a day (BID) | ORAL | Status: DC
  Administered 2018-12-13 – 2018-12-14 (×3): 50 mg via ORAL
  Filled 2018-12-13 (×3): qty 1

## 2018-12-13 NOTE — Progress Notes (Signed)
  Echocardiogram 2D Echocardiogram has been performed.  Stephanie Wagner 12/13/2018, 11:39 AM

## 2018-12-13 NOTE — Progress Notes (Addendum)
Progress Note  Patient Name: Stephanie Wagner Date of Encounter: 12/13/2018  Primary Cardiologist: No primary care provider on file.   Subjective   CP improved.   Inpatient Medications    Scheduled Meds: . aspirin  81 mg Oral Daily  . atorvastatin  40 mg Oral q1800  . clopidogrel  75 mg Oral Q breakfast  . clotrimazole   Topical BID  . metoprolol tartrate  50 mg Oral BID  . sodium chloride flush  3 mL Intravenous Q12H   Continuous Infusions: . sodium chloride     PRN Meds: sodium chloride, acetaminophen, ibuprofen, ondansetron (ZOFRAN) IV, sodium chloride flush, traMADol   Vital Signs    Vitals:   12/12/18 1800 12/12/18 2237 12/13/18 0645 12/13/18 0800  BP: 118/90 107/80 108/83 109/78  Pulse: 97 73 91 96  Resp: 14 11 12 14   Temp:   98.3 F (36.8 C) 98.8 F (37.1 C)  TempSrc:   Oral Oral  SpO2: 100% 99%  99%  Weight:   67.6 kg   Height:    5\' 8"  (1.727 m)    Intake/Output Summary (Last 24 hours) at 12/13/2018 0914 Last data filed at 12/12/2018 2150 Gross per 24 hour  Intake 120 ml  Output -  Net 120 ml   Last 3 Weights 12/13/2018 12/09/2018 12/09/2018  Weight (lbs) 149 lb 1.6 oz 150 lb 12.7 oz 145 lb  Weight (kg) 67.631 kg 68.4 kg 65.772 kg      Telemetry    Sinus tach - Personally Reviewed  ECG    None recent - Personally Reviewed  Physical Exam   GEN: No acute distress.   Neck: No JVD Cardiac: RRR, no murmurs, rubs, or gallops. Intermittent S4  Respiratory: Clear to auscultation bilaterally. GI: Soft, nontender, non-distended  MS: No edema; No deformity. Neuro:  Nonfocal  Psych: Normal affect   Labs    Chemistry Recent Labs  Lab 12/09/18 1135  12/10/18 0516 12/11/18 1044 12/12/18 0606  NA 136   < > 136 136 136  K 3.6   < > 3.9 4.1 4.2  CL 105   < > 105 104 101  CO2 22   < > 21* 26 27  GLUCOSE 131*   < > 108* 98 93  BUN 10   < > 11 11 14   CREATININE 0.69   < > 0.70 0.77 0.84  CALCIUM 9.4   < > 8.2* 8.7* 8.8*  PROT 6.5  --   --    --   --   ALBUMIN 3.8  --   --   --   --   AST 33  --   --   --   --   ALT 28  --   --   --   --   ALKPHOS 67  --   --   --   --   BILITOT 0.7  --   --   --   --   GFRNONAA >60   < > >60 >60 >60  GFRAA >60   < > >60 >60 >60  ANIONGAP 9   < > 10 6 8    < > = values in this interval not displayed.     Hematology Recent Labs  Lab 12/10/18 0516 12/11/18 1044 12/12/18 0606  WBC 9.0 6.6 6.3  RBC 3.73* 3.97 4.12  HGB 10.5* 11.0* 11.5*  HCT 32.1* 34.9* 36.3  MCV 86.1 87.9 88.1  MCH 28.2 27.7 27.9  MCHC 32.7 31.5  31.7  RDW 13.1 13.2 13.2  PLT 263 242 249    Cardiac Enzymes Recent Labs  Lab 12/09/18 2202 12/10/18 0516 12/10/18 1051 12/11/18 1044  TROPONINI 15.35* 16.35* 11.34* 6.74*   No results for input(s): TROPIPOC in the last 168 hours.   BNPNo results for input(s): BNP, PROBNP in the last 168 hours.   DDimer No results for input(s): DDIMER in the last 168 hours.   Radiology    No results found.  Cardiac Studies   Cath films reviewed  Patient Profile     38 y.o. female with distal LAD SCAD  Assessment & Plan    1) SCAD: Treating with DAPT for 30 days. Echo to look for apical thrombus.  Increase beta blocker due to sinus tach.  Further plans based on echo result.    CP improved today.  I encouraged her to ambulate.    For questions or updates, please contact [facility] Please consult [email or web address] for contact info under        Signed, Lance Muss, MD  12/13/2018, 9:14 AM     Addendum: No thrombus on echo.  SHe is feeling better. Plan for discharge tomorrow.   Corky Crafts, MD

## 2018-12-13 NOTE — Progress Notes (Signed)
CARDIAC REHAB PHASE I   PRE:  Rate/Rhythm: 78 SR  BP:  Supine:   Sitting: 96/70  Standing:    SaO2: 97%RA  MODE:  Ambulation: 470 ft   POST:  Rate/Rhythm: 103 ST  BP:  Supine:   Sitting: 101/76  Standing:    SaO2: 98%RA 1308-1335 Pt walked 470 ft on RA with hand held asst with steady gait. Pace a little faster today and did not need to sit to rest. Tolerated well. Second walk today. HR to 103. Back to bed after walk.   Luetta Nutting, RN BSN  12/13/2018 1:31 PM

## 2018-12-14 MED ORDER — ASPIRIN 81 MG PO TBEC: 81.0000 mg | DELAYED_RELEASE_TABLET | Freq: Every day | ORAL | 3 refills | Status: DC

## 2018-12-14 MED ORDER — ATORVASTATIN CALCIUM 40 MG PO TABS: 40.0000 mg | ORAL_TABLET | Freq: Every day | ORAL | 11 refills | Status: DC

## 2018-12-14 MED ORDER — CLOPIDOGREL BISULFATE 75 MG PO TABS: 75.0000 mg | ORAL_TABLET | Freq: Every day | ORAL | 0 refills | Status: DC

## 2018-12-14 MED ORDER — METOPROLOL TARTRATE 50 MG PO TABS: 50.0000 mg | ORAL_TABLET | Freq: Two times a day (BID) | ORAL | 11 refills | Status: DC

## 2018-12-14 MED ORDER — ASPIRIN EC 81 MG PO TBEC
81.0000 mg | DELAYED_RELEASE_TABLET | Freq: Every day | ORAL | Status: DC
  Administered 2018-12-14: 81 mg via ORAL
  Filled 2018-12-14: qty 1

## 2018-12-14 MED FILL — ASPIRIN LOW DOSE 81 MG TBEC: 81 | 90 days supply | Qty: 90 | Fill #0 | Status: TO

## 2018-12-14 MED FILL — ATORVASTATIN CALCIUM 40 MG: 40 | 30 days supply | Qty: 30 | Fill #0 | Status: TO

## 2018-12-14 MED FILL — METOPROLOL TARTRATE 50 MG T: 50 | 30 days supply | Qty: 60 | Fill #0 | Status: TO

## 2018-12-14 MED FILL — CLOPIDOGREL 75 MG TABLET: 75 | 30 days supply | Qty: 30 | Fill #0 | Status: TO

## 2018-12-14 NOTE — Progress Notes (Addendum)
Progress Note  Patient Name: Stephanie Wagner Date of Encounter: 12/14/2018  Primary Cardiologist: No primary care provider on file.   Subjective   Patient doing well. CP and SOB have improved. No questions or concerns.   Inpatient Medications    Scheduled Meds: . aspirin EC  81 mg Oral Daily  . atorvastatin  40 mg Oral q1800  . clopidogrel  75 mg Oral Q breakfast  . clotrimazole   Topical BID  . metoprolol tartrate  50 mg Oral BID  . sodium chloride flush  3 mL Intravenous Q12H   Continuous Infusions: . sodium chloride     PRN Meds: sodium chloride, acetaminophen, ibuprofen, ondansetron (ZOFRAN) IV, sodium chloride flush, traMADol   Vital Signs    Vitals:   12/13/18 1942 12/13/18 2006 12/14/18 0628 12/14/18 0924  BP:  109/84 97/67 103/74  Pulse: 98 91 85 (!) 102  Resp:      Temp:  98 F (36.7 C) 98.4 F (36.9 C)   TempSrc:  Oral Oral   SpO2: 99% 99% 99%   Weight:   63.9 kg   Height:        Intake/Output Summary (Last 24 hours) at 12/14/2018 1131 Last data filed at 12/14/2018 0900 Gross per 24 hour  Intake 610 ml  Output -  Net 610 ml   Last 3 Weights 12/14/2018 12/13/2018 12/13/2018  Weight (lbs) 140 lb 14.4 oz 143 lb 8.3 oz 149 lb 1.6 oz  Weight (kg) 63.912 kg 65.1 kg 67.631 kg      Telemetry    NSR - Personally Reviewed  ECG    No new ECG  Physical Exam   GEN: No acute distress.   Neck: No JVD Cardiac: RRR, no murmurs, rubs, or gallops.  Respiratory: Clear to auscultation bilaterally. GI: Soft, nontender, non-distended  MS: No edema; No deformity. Neuro:  Nonfocal  Psych: Normal affect   Labs    Chemistry Recent Labs  Lab 12/09/18 1135  12/10/18 0516 12/11/18 1044 12/12/18 0606  NA 136   < > 136 136 136  K 3.6   < > 3.9 4.1 4.2  CL 105   < > 105 104 101  CO2 22   < > 21* 26 27  GLUCOSE 131*   < > 108* 98 93  BUN 10   < > 11 11 14   CREATININE 0.69   < > 0.70 0.77 0.84  CALCIUM 9.4   < > 8.2* 8.7* 8.8*  PROT 6.5  --   --   --    --   ALBUMIN 3.8  --   --   --   --   AST 33  --   --   --   --   ALT 28  --   --   --   --   ALKPHOS 67  --   --   --   --   BILITOT 0.7  --   --   --   --   GFRNONAA >60   < > >60 >60 >60  GFRAA >60   < > >60 >60 >60  ANIONGAP 9   < > 10 6 8    < > = values in this interval not displayed.    Hematology Recent Labs  Lab 12/10/18 0516 12/11/18 1044 12/12/18 0606  WBC 9.0 6.6 6.3  RBC 3.73* 3.97 4.12  HGB 10.5* 11.0* 11.5*  HCT 32.1* 34.9* 36.3  MCV 86.1 87.9 88.1  MCH 28.2 27.7  27.9  MCHC 32.7 31.5 31.7  RDW 13.1 13.2 13.2  PLT 263 242 249   Cardiac Enzymes Recent Labs  Lab 12/09/18 2202 12/10/18 0516 12/10/18 1051 12/11/18 1044  TROPONINI 15.35* 16.35* 11.34* 6.74*   No results for input(s): TROPIPOC in the last 168 hours.   BNPNo results for input(s): BNP, PROBNP in the last 168 hours.   DDimer No results for input(s): DDIMER in the last 168 hours.   Radiology    No results found.  Cardiac Studies   Left Heart Cath 12/09/2018 1. 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 2. Normal left ventricular filling pressure. 3. Balloon angioplasty to distal LAD with reestablishment of flow. The apical LAD remains occluded. 4. Possible IV contrast reaction, improved with administration of methylprednisolone 125 mg x 1 and diphenhydramine 50 mg x 1.  Patient Profile     38 y.o. female with distal LAD SCAD  Assessment & Plan    SCAD - Continue DAPT for 30 days  - Continue metoprolol  - Continue atorvastatin  - Will follow up with cardiology  - Stable for discharge today   For questions or updates, please contact [facility] Please consult [email or web address] for contact info under   Signed, Levora Dredge, MD  12/14/2018, 11:31 AM    I have examined the patient and reviewed assessment and plan and discussed with patient.  Agree with above as stated.  Walking well without problems.  We went over her meds.   COntinue aspirin and beta blocker indefinitely.  Plavix for a month.  Statin will be short term as well.    OK for discharge.    Avoid heavy lifting.  Avoid pregnancy.  Topical creme for ringworm.  Lance Muss

## 2018-12-14 NOTE — Progress Notes (Signed)
Pt stable, dc home with her mother via wheelchair

## 2018-12-14 NOTE — Discharge Summary (Addendum)
Discharge Summary    Patient ID: Stephanie Wagner,  MRN: [id number], DOB/AGE: [birth year] 38 y.o.  Admit date: 12/09/2018 Discharge date: 12/14/2018  Primary Care Provider: Patient, No Pcp Per Primary Cardiologist: Dr. Eldridge Dace  Discharge Diagnoses    Active Problems:   ST elevation myocardial infarction involving left anterior descending (LAD) coronary artery [facility])   STEMI involving left anterior descending coronary artery (HCC)   Allergies Allergies  Allergen Reactions  . Contrast Media [Iodinated Diagnostic Agents] Shortness Of Breath    Dyspnea and rash during heart catheterization  . Loratadine Palpitations and Other (See Comments)    Just during pregnancy  . Morphine And Related Nausea And Vomiting  . Penicillins Nausea And Vomiting and Rash    Did it involve swelling of the face/tongue/throat, SOB, or low BP? No Did it involve sudden or severe rash/hives, skin peeling, or any reaction on the inside of your mouth or nose? Yes Did you need to seek medical attention at a hospital or doctor's office? No When did it last happen?childhood If all above answers are "NO", may proceed with cephalosporin use.     Diagnostic Studies/Procedures    Cath: 12/09/2018  Conclusions: 1. 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 2. Normal left ventricular filling pressure. 3. Balloon angioplasty to distal LAD with reestablishment of flow. The apical LAD remains occluded. 4. Possible IV contrast reaction, improved with administration of methylprednisolone 125 mg x 1 and diphenhydramine 50 mg x 1.  Recommendations: 1. Medical therapy, including titration of nitroglycerin infusion for relief of chest pain. 2. Discontinue cangrelor at 1:30 pm; administer clopidogrel 600 mg x 1 at 1:30 PM. 3. Dual antiplatelet therapy with aspirin and clopidogrel for at least 1 month, then consider single antiplatelet therapy with aspirin 81 mg  daily thereafter. 4. Obtain echocardiogram.  Stephanie Kendall, MD  TTE: 12/10/2018  IMPRESSIONS    1. The cavity size was normal. There is mildly increased left ventricular wall thickness. Left ventricular ejection fraction is 55-60% overall. There is distal anteroseptal and periapical akinesis. Apical trabeculation with muscle strand and shadowing  noted - consider Definity contrast study to more clearly exclude thrombus.  2. The right ventricle has normal systolic function. The cavity was normal. There is no increase in right ventricular wall thickness.  3. The aortic valve is tricuspid Mild aortic annular calcification noted.  4. The tricuspid valve is normal in structure.  5. The aortic root is normal in size and structure.  6. The mitral valve is degenerative. Mild thickening of the mitral valve leaflet.  TTE: 12/13/2018  FINDINGS  Left Ventricle: Definity contrast agent was given IV to delineate the left ventricular endocardial borders. Limited echo with Definity to assess LV wall motion. There is apical akinesis, EF 50%. No thrombus noted. _____________   History of Present Illness     Stephanie Wagner is a 38 yo female who was in [facility] until the morning of admission when had substernal chest pressure 10am. Radiating to Left arm with dyspnea. EMS found anterior St elevation and code STEMI activated. She got ASA 324mg , SL nitro x 2 and Zofran en route. Upon ER arrival she continued to have 8/10 chest pressure, radiating to left arm. Taken emergently to cath lab.   No hx of tobacco smoking or illicit drug use.   Hospital Course      Underwent cardiac cath noted above with single-vessel CAD with occlusion of distal LAD due to spontaneous coronary  artery dissection. Balloon angioplasty of the distal LAD, but apical LAD remains occluded. Plan for DAPT with ASA/plavix for one month, then consideration of ASA thereafter. Added metoprolol which was titrated to 50mg  BID. Still tachycardiac at  times but unable to further titrate given soft blood pressures. She was seen by cardiac rehab and did well. Still had mild chest pain but significantly improved from admission. Echo with normal EF but distal anteroseptal and periapical akinesis, with concern for thrombus. Follow up limited Echo with no thrombus noted. She was also placed on statin, LDL 68. May be able to discontinue after several months.    General: Well developed, well nourished, female appearing in no acute distress. Head: Normocephalic, atraumatic.  Neck: Supple without bruits, JVD. Lungs:  Resp regular and unlabored, CTA. Heart: RRR, S1, S2, no S3, S4, or murmur; no rub. Abdomen: Soft, non-tender, non-distended with normoactive bowel sounds. No hepatomegaly. No rebound/guarding. No obvious abdominal masses. Extremities: No clubbing, cyanosis, edema. Distal pedal pulses are 2+ bilaterally. Right femoral cath site stable without bruising or hematoma Neuro: Alert and oriented X 3. Moves all extremities spontaneously. Psych: Normal affect.  Stephanie Wagner was seen by Dr. Eldridge DaceVaranasi and determined stable for discharge home. Follow up in the office has been arranged. Medications are listed below.   _____________  Discharge Vitals Blood pressure 103/74, pulse (!) 102, temperature 98.4 F (36.9 C), temperature source Oral, resp. rate 18, height 5\' 8"  (1.727 m), weight 63.9 kg, SpO2 99 %, unknown if currently breastfeeding.  Filed Weights   12/13/18 0645 12/13/18 0800 12/14/18 0628  Weight: 67.6 kg 65.1 kg 63.9 kg    Labs & Radiologic Studies    CBC Recent Labs    12/12/18 0606  WBC 6.3  HGB 11.5*  HCT 36.3  MCV 88.1  PLT 249   Basic Metabolic Panel Recent Labs    16/07/9601/24/20 0606  NA 136  K 4.2  CL 101  CO2 27  GLUCOSE 93  BUN 14  CREATININE 0.84  CALCIUM 8.8*   Liver Function Tests No results for input(s): AST, ALT, ALKPHOS, BILITOT, PROT, ALBUMIN in the last 72 hours. No results for input(s): LIPASE,  AMYLASE in the last 72 hours. Cardiac Enzymes No results for input(s): CKTOTAL, CKMB, CKMBINDEX, TROPONINI in the last 72 hours. BNP Invalid input(s): POCBNP D-Dimer No results for input(s): DDIMER in the last 72 hours. Hemoglobin A1C No results for input(s): HGBA1C in the last 72 hours. Fasting Lipid Panel No results for input(s): CHOL, HDL, LDLCALC, TRIG, CHOLHDL, LDLDIRECT in the last 72 hours. Thyroid Function Tests No results for input(s): TSH, T4TOTAL, T3FREE, THYROIDAB in the last 72 hours.  Invalid input(s): FREET3 _____________  No results found. Disposition   Pt is being discharged home today in good condition.  Follow-up Plans & Appointments    Follow-up Information    Allayne ButcherSimmons, Brittainy M, PA-C Follow up on 12/27/2018.   Specialties:  Cardiology, Radiology Why:  at 9:00am for your follow up appt.  Contact information: [address] [place] [address] [phone]          Discharge Instructions    Amb Referral to Cardiac Rehabilitation   Complete by:  As directed    Referring to [place] [facility] 2   Diagnosis:   STEMI Comment - SCAD PTCA     Diet - low sodium heart healthy   Complete by:  As directed    Discharge instructions   Complete by:  As directed  Groin Site Care Refer to this sheet in the next few weeks. These instructions provide you with information on caring for yourself after your procedure. Your caregiver may also give you more specific instructions. Your treatment has been planned according to current medical practices, but problems sometimes occur. Call your caregiver if you have any problems or questions after your procedure. HOME CARE INSTRUCTIONS You may shower 24 hours after the procedure. Remove the bandage (dressing) and gently wash the site with plain soap and water. Gently pat the site dry.  Do not apply powder or lotion to the site.  Do not sit in a bathtub, swimming pool, or whirlpool for 5 to 7 days.  No bending,  squatting, or lifting anything over 10 pounds (4.5 kg) as directed by your caregiver.  Inspect the site at least twice daily.  Do not drive home if you are discharged the same day of the procedure. Have someone else drive you.  You may drive 24 hours after the procedure unless otherwise instructed by your caregiver.  What to expect: Any bruising will usually fade within 1 to 2 weeks.  Blood that collects in the tissue (hematoma) may be painful to the touch. It should usually decrease in size and tenderness within 1 to 2 weeks.  SEEK IMMEDIATE MEDICAL CARE IF: You have unusual pain at the groin site or down the affected leg.  You have redness, warmth, swelling, or pain at the groin site.  You have drainage (other than a small amount of blood on the dressing).  You have chills.  You have a fever or persistent symptoms for more than 72 hours.  You have a fever and your symptoms suddenly get worse.  Your leg becomes pale, cool, tingly, or numb.  You have heavy bleeding from the site. Hold pressure on the site. .  You will need to limit your activity for the next 4-6 weeks. Please monitor your HR and BP at home and bring to your follow up appt.   Increase activity slowly   Complete by:  As directed       Discharge Medications     Medication List    STOP taking these medications   ibuprofen 200 MG tablet Commonly known as:  ADVIL,MOTRIN     TAKE these medications   aspirin 81 MG EC tablet Take 1 tablet (81 mg total) by mouth daily.   atorvastatin 40 MG tablet Commonly known as:  LIPITOR Take 1 tablet (40 mg total) by mouth daily at 6 PM.   clopidogrel 75 MG tablet Commonly known as:  PLAVIX Take 1 tablet (75 mg total) by mouth daily with breakfast.   Melatonin 1 MG Tabs Take 1 mg by mouth at bedtime as needed (sleep).   metoprolol tartrate 50 MG tablet Commonly known as:  LOPRESSOR Take 1 tablet (50 mg total) by mouth 2 (two) times daily.   multivitamin with minerals  Tabs tablet Take 1 tablet by mouth daily.        Acute coronary syndrome (MI, NSTEMI, STEMI, etc) this admission?: No.   Outstanding Labs/Studies   FLP/LFTs in 6 weeks.  Duration of Discharge Encounter   Greater than 30 minutes including physician time.  Signed, Laverda Page NP-C 12/14/2018, 11:37 AM   I have examined the patient and reviewed assessment and plan and discussed with patient.  Agree with above as stated.  Walking well without problems.  We went over her meds.  COntinue aspirin and beta blocker indefinitely.  Plavix  for a month.  Statin will be short term as well.    OK for discharge.    Avoid heavy lifting.  Avoid pregnancy.  Topical creme for ringworm.  Lance Muss

## 2018-12-14 NOTE — Progress Notes (Signed)
CARDIAC REHAB PHASE I   PRE:  Rate/Rhythm: 89 SR  BP:  Supine:   Sitting: 101/70  Standing:    SaO2:   MODE:  Ambulation: 470 ft   POST:  Rate/Rhythm: 103 ST  BP:  Supine:   Sitting: 103/79  Standing:    SaO2:   1105-1125 Pt walked 470 ft on RA with hand held asst. Talked during walk and stopped once to rest. Tolerated well. Luetta Nutting, RN BSN  12/14/2018 11:20 AM

## 2018-12-16 ENCOUNTER — Telehealth: Payer: Self-pay | Admitting: Cardiology

## 2018-12-16 NOTE — Telephone Encounter (Signed)
New Message    Pt c/o medication issue:  1. Name of Medication: Plavix  2. How are you currently taking this medication (dosage and times per day)? 75mg  1x daily   3. Are you having a reaction (difficulty breathing--STAT)? No  4. What is your medication issue? PT had a bloody nose and not sure if it is due to medication.       PTs mother is calling because she said the PT is having muscle pain legs ankles and feet. PT has a bad headache. PT also had a bloody nose, which they state is odd for her.   PT wanted to reschedule TOC appt because of transportation issues.  Was unable to find someone on care team.

## 2018-12-16 NOTE — Telephone Encounter (Signed)
Spoke to patient. Patient with recent STEMI/SCAD. She states that she has had increased muscle pain in her legs and ankles. Patient recently started atorvastatin. Made her aware that this is one of the side effects of this medicine. Patient states that she had one episode of a nose bleed the other day. She denies any other S/Sx of bleeding (ie. increased bruising, blood in urine or stool, etc.). She states that her cath site looks good. She is taking plavix and ASA. She denies using NSAIDs. She also c/o HA. BP 107/62 HR 80s. She has not taken anything for this. She denies changes in vision, speech, or strength, lightheadedness, dizziness, syncope, or any other Sx. Instructed for the patient to try Tylenol for her HAs. Appointment made for her to see Dr. Eldridge Dace on 3/4 at 1:40 PM for hospital follow up. Patient will continue to monitor and let us know if she has a change in her Sx. Patient verbalized understanding and thanked me for the call.

## 2018-12-19 ENCOUNTER — Telehealth: Payer: Self-pay | Admitting: Physician Assistant

## 2018-12-19 NOTE — Telephone Encounter (Signed)
Patient called stating she had two very brief episodes of heart palpitations with brief chest pain. She was not lightheaded or dizzy and was not pre-syncopal with these episodes. The chest pain was not as severe as when she left the hospital. I advised her that it would be easier to evaluate for a tachy-arrhythmia in the ER. She states that she would prefer to wait until her follow up appt on Wed. I asked her to come to the ER if she continued to have episodes or became lightheaded or dizzy. She agreed to this plan. She has been compliant on her medications.   Roe Rutherford Krisanne Lich, PA-C 12/19/2018, 7:39 PM

## 2018-12-20 ENCOUNTER — Encounter: Payer: Self-pay | Admitting: Interventional Cardiology

## 2018-12-20 NOTE — Progress Notes (Signed)
Cardiology Office Note   Date:  12/21/2018   ID:  Stephanie Wagner, DOB 1981-06-08, MRN [id number]  PCP:  Laqueta Due., MD    No chief complaint on file.  SCAD  Wt Readings from Last 3 Encounters:  12/21/18 145 lb (65.8 kg)  12/14/18 140 lb 14.4 oz (63.9 kg)  05/08/16 135 lb (61.2 kg)       History of Present Illness: Stephanie Wagner is a 38 y.o. female  Who had an LAD dissection (SCAD).  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 Normal left ventricular filling pressure. Balloon angioplasty to distal LAD with reestablishment of flow. The apical LAD remains occluded. Possible IV contrast reaction, improved with administration of methylprednisolone 125 mg x 1 and diphenhydramine 50 mg x 1.  Echo before discharge showed: Left Ventricle: Definity contrast agent was given IV to delineate the left ventricular endocardial borders. Limited echo with Definity to assess LV wall motion. There is apical akinesis, EF 50%. No thrombus noted.  Occsaional sharp chest pain.  She has had fatigue.    Denies : Dizziness. Leg edema. Nitroglycerin use. Orthopnea. Palpitations. Paroxysmal nocturnal dyspnea. Shortness of breath. Syncope.      Past Medical History:  Diagnosis Date  . Back pain   . Chronic headaches   . Environmental allergies    animals, dander  . Migraine   . Neck pain   . Seasonal allergies   . Tachycardia     Past Surgical History:  Procedure Laterality Date  . CORONARY/GRAFT ACUTE MI REVASCULARIZATION N/A 12/09/2018   Procedure: CORONARY/GRAFT ACUTE MI REVASCULARIZATION;  Surgeon: Yvonne Kendall, MD;  Location: [facility]IVE [facility];  Service: Cardiovascular;  Laterality: N/A;  . DILATION AND CURETTAGE OF UTERUS  08/31/2014   Procedure: Dilatation and Currettage with Ultrasound Guidance;  Surgeon: Sharon Seller, DO;  Location: [facility] OR[facility];  Service: Gynecology;;  . LEFT HEART CATH AND CORONARY  ANGIOGRAPHY N/A 12/09/2018   Procedure: LEFT HEART CATH AND CORONARY ANGIOGRAPHY;  Surgeon: Yvonne Kendall, MD;  Location: [facility] INVASIVE CV LAB;  Service: Cardiovascular;  Laterality: N/A;  . WISDOM TOOTH EXTRACTION       Current Outpatient Medications  Medication Sig Dispense Refill  . aspirin EC 81 MG EC tablet Take 1 tablet (81 mg total) by mouth daily. 90 tablet 3  . atorvastatin (LIPITOR) 40 MG tablet Take 1 tablet (40 mg total) by mouth daily at 6 PM. 30 tablet 11  . clopidogrel (PLAVIX) 75 MG tablet Take 1 tablet (75 mg total) by mouth daily with breakfast. 30 tablet 0  . Melatonin 1 MG TABS Take 1 mg by mouth at bedtime as needed (sleep).    . metoprolol tartrate (LOPRESSOR) 50 MG tablet Take 1 tablet (50 mg total) by mouth 2 (two) times daily. 60 tablet 11  . Multiple Vitamin (MULTIVITAMIN WITH MINERALS) TABS tablet Take 1 tablet by mouth daily.     No current facility-administered medications for this visit.     Allergies:   Contrast media [iodinated diagnostic agents]; Loratadine; Morphine and related; and Penicillins    Social History:  The patient  reports that she has never smoked. She has never used smokeless tobacco. She reports that she does not drink alcohol or use drugs.   Family History:  The patient's family history includes Breast cancer in her maternal grandmother; Cancer in her maternal grandfather; Diabetes in her father; Heart disease in her maternal grandfather; Kidney disease  in her father; Thyroid disease in her mother.    ROS:  Please see the history of present illness.   Otherwise, review of systems are positive for chest pain.   All other systems are reviewed and negative.    PHYSICAL EXAM: VS:  BP 100/64   Pulse 83   Ht 5\' 8"  (1.727 m)   Wt 145 lb (65.8 kg)   SpO2 99%   BMI 22.05 kg/m  , BMI Body mass index is 22.05 kg/m. GEN: Well nourished, well developed, in no acute distress  HEENT: normal  Neck: no JVD, carotid bruits, or masses Cardiac:  RRR; no murmurs, rubs, or gallops,no edema  Respiratory:  clear to auscultation bilaterally, normal work of breathing GI: soft, nontender, nondistended, + BS MS: no deformity or atrophy  Skin: warm and dry, no rash Neuro:  Strength and sensation are intact Psych: euthymic mood, full affect    Recent Labs: 12/09/2018: ALT 28 12/12/2018: BUN 14; Creatinine, Ser 0.84; Hemoglobin 11.5; Platelets 249; Potassium 4.2; Sodium 136   Lipid Panel    Component Value Date/Time   CHOL 127 12/10/2018 0516   TRIG 42 12/10/2018 0516   HDL 51 12/10/2018 0516   CHOLHDL 2.5 12/10/2018 0516   VLDL 8 12/10/2018 0516   LDLCALC 68 12/10/2018 0516     Other studies Reviewed: Additional studies/ records that were reviewed today with results demonstrating: cath result reviewed.   ASSESSMENT AND PLAN:  1. SCAD: Preserved LV function.  Will stop Plavix after a month of treatment.  Stop atorvastatin after a month of treatment.  Her lipids are well controlled. 2. Fatigue: We will decrease metoprolol to 25 mg p.o. twice daily.  If she feels worse, can go back to 50 mg p.o. twice daily.  Would also consider it in between dose of 37.5 mg twice daily.  She would need 25 mg tablets called and if we would use that last option. 3. Chest pain: She still has residual chest pains, which are atypical.  Some of this may be related to anxiety.  She has not had any of the symptoms that she had with her SCAD.   Current medicines are reviewed at length with the patient today.  The patient concerns regarding her medicines were addressed.  The following changes have been made:  No change  Labs/ tests ordered today include:  No orders of the defined types were placed in this encounter.   Recommend 150 minutes/week of aerobic exercise Low fat, low carb, high fiber diet recommended  Disposition:   FU in 6 weeks   Signed, Lance Muss, MD  12/21/2018 1:48 PM    [facility] [address] [place],  [place], [place]  [address] Phone: [phone]; Fax: [phone]

## 2018-12-21 ENCOUNTER — Encounter: Payer: Self-pay | Admitting: Interventional Cardiology

## 2018-12-21 ENCOUNTER — Ambulatory Visit: Payer: 59 | Admitting: Interventional Cardiology

## 2018-12-21 VITALS — BP 100/64 | HR 83 | Ht 68.0 in | Wt 145.0 lb

## 2018-12-21 DIAGNOSIS — I2542 Coronary artery dissection: Secondary | ICD-10-CM | POA: Diagnosis not present

## 2018-12-21 DIAGNOSIS — R5383 Other fatigue: Secondary | ICD-10-CM | POA: Diagnosis not present

## 2018-12-21 DIAGNOSIS — R0782 Intercostal pain: Secondary | ICD-10-CM | POA: Diagnosis not present

## 2018-12-21 MED ORDER — ASPIRIN 81 MG PO TBEC: 81.0000 mg | DELAYED_RELEASE_TABLET | Freq: Every day | ORAL | 3 refills | Status: DC

## 2018-12-21 MED ORDER — METOPROLOL TARTRATE 25 MG PO TABS: 25.0000 mg | ORAL_TABLET | Freq: Two times a day (BID) | ORAL | 3 refills | Status: DC

## 2018-12-21 NOTE — Patient Instructions (Signed)
Medication Instructions:  Your physician has recommended you make the following change in your medication:   1. DECREASE: metoprolol tartrate (lopressor) to 25 mg twice a day  2. You may stop atorvastatin (lipitor) and clopidogrel (plavix) after 01/07/19   Lab work: None Ordered  If you have labs (blood work) drawn today and your tests are completely normal, you will receive your results only by: Marland Kitchen MyChart Message (if you have MyChart) OR . A paper copy in the mail If you have any lab test that is abnormal or we need to change your treatment, we will call you to review the results.  Testing/Procedures: None ordered  Follow-Up: . Your physician recommends that you schedule a follow-up appointment in: 6 weeks with Dr. Eldridge Dace .   Any Other Special Instructions Will Be Listed Below (If Applicable).

## 2018-12-22 ENCOUNTER — Ambulatory Visit: Payer: 59 | Admitting: Physician Assistant

## 2018-12-24 ENCOUNTER — Encounter (HOSPITAL_BASED_OUTPATIENT_CLINIC_OR_DEPARTMENT_OTHER): Payer: Self-pay | Admitting: Adult Health

## 2018-12-24 ENCOUNTER — Telehealth: Payer: Self-pay | Admitting: Physician Assistant

## 2018-12-24 ENCOUNTER — Emergency Department (HOSPITAL_BASED_OUTPATIENT_CLINIC_OR_DEPARTMENT_OTHER)
Admission: EM | Admit: 2018-12-24 | Discharge: 2018-12-24 | Disposition: A | Discharge status: Home / Self Care | Payer: 59 | Attending: Emergency Medicine | Admitting: Emergency Medicine

## 2018-12-24 ENCOUNTER — Other Ambulatory Visit: Payer: Self-pay

## 2018-12-24 DIAGNOSIS — Z7902 Long term (current) use of antithrombotics/antiplatelets: Secondary | ICD-10-CM | POA: Insufficient documentation

## 2018-12-24 DIAGNOSIS — Z7982 Long term (current) use of aspirin: Secondary | ICD-10-CM | POA: Insufficient documentation

## 2018-12-24 DIAGNOSIS — N939 Abnormal uterine and vaginal bleeding, unspecified: Secondary | ICD-10-CM

## 2018-12-24 DIAGNOSIS — Z79899 Other long term (current) drug therapy: Secondary | ICD-10-CM | POA: Insufficient documentation

## 2018-12-24 DIAGNOSIS — N938 Other specified abnormal uterine and vaginal bleeding: Secondary | ICD-10-CM | POA: Insufficient documentation

## 2018-12-24 HISTORY — DX: Acute myocardial infarction, unspecified: I21.9

## 2018-12-24 LAB — CBC WITH DIFFERENTIAL/PLATELET
Abs Immature Granulocytes: 0.02 10*3/uL (ref 0.00–0.07)
Basophils Absolute: 0.1 10*3/uL (ref 0.0–0.1)
Basophils Relative: 1 %
Eosinophils Absolute: 0.2 10*3/uL (ref 0.0–0.5)
Eosinophils Relative: 3 %
HCT: 40 % (ref 36.0–46.0)
Hemoglobin: 12.9 g/dL (ref 12.0–15.0)
Immature Granulocytes: 0 %
Lymphocytes Relative: 11 %
Lymphs Abs: 0.9 10*3/uL (ref 0.7–4.0)
MCH: 28.4 pg (ref 26.0–34.0)
MCHC: 32.3 g/dL (ref 30.0–36.0)
MCV: 88.1 fL (ref 80.0–100.0)
MONO ABS: 1 10*3/uL (ref 0.1–1.0)
Monocytes Relative: 12 %
NEUTROS ABS: 6 10*3/uL (ref 1.7–7.7)
NEUTROS PCT: 73 %
Platelets: 311 10*3/uL (ref 150–400)
RBC: 4.54 MIL/uL (ref 3.87–5.11)
RDW: 13.2 % (ref 11.5–15.5)
WBC: 8.1 10*3/uL (ref 4.0–10.5)
nRBC: 0 % (ref 0.0–0.2)

## 2018-12-24 LAB — URINALYSIS, ROUTINE W REFLEX MICROSCOPIC
Bilirubin Urine: NEGATIVE
Glucose, UA: NEGATIVE mg/dL
Ketones, ur: NEGATIVE mg/dL
LEUKOCYTE UA: NEGATIVE
Nitrite: NEGATIVE
Protein, ur: NEGATIVE mg/dL
Specific Gravity, Urine: <1.005 — ABNORMAL LOW (ref 1.005–1.030)
pH: 6.5 (ref 5.0–8.0)

## 2018-12-24 LAB — URINALYSIS, MICROSCOPIC (REFLEX): WBC, UA: NONE SEEN WBC/hpf (ref 0–5)

## 2018-12-24 LAB — BASIC METABOLIC PANEL
Anion gap: 7 (ref 5–15)
BUN: 15 mg/dL (ref 6–20)
CALCIUM: 8.9 mg/dL (ref 8.9–10.3)
CO2: 23 mmol/L (ref 22–32)
Chloride: 104 mmol/L (ref 98–111)
Creatinine, Ser: 0.7 mg/dL (ref 0.44–1.00)
GFR calc Af Amer: >60 mL/min (ref >60)
GFR calc non Af Amer: >60 mL/min (ref >60)
Glucose, Bld: 98 mg/dL (ref 70–99)
Potassium: 3.7 mmol/L (ref 3.5–5.1)
Sodium: 134 mmol/L — ABNORMAL LOW (ref 135–145)

## 2018-12-24 LAB — PREGNANCY, URINE: Preg Test, Ur: NEGATIVE

## 2018-12-24 NOTE — ED Provider Notes (Signed)
[facility] EMERGENCY DEPARTMENT Provider Note   CSN: [id number] Arrival date & time: 12/24/18  1035    History   Chief Complaint Chief Complaint  Patient presents with  . Vaginal Bleeding    HPI Stephanie Wagner is a 38 y.o. 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  Patient states that she started her normal menstrual cycle early this morning.  She reports that since then, she has had worsening vaginal bleeding and is passing large clots.  She states that she was sitting on the couch when she went to stand up, she had a gush of blood followed by clots.  Patient states that she is fully saturating pads.  She states that this is normal time for her to start but usually it is not this heavy.  Patient recently had a MI and was discharged on 12/14/18.  Patient was discharged home on Plavix and aspirin which she states she has been taking.  She reports some lower abdominal cramping but otherwise denies any abdominal pain, nausea/vomiting, fever.  Patient states she has not had any intercourse recently.     The history is provided by the patient.    Past Medical History:  Diagnosis Date  . Back pain   . Chronic headaches   . Environmental allergies    animals, dander  . Migraine   . Myocardial infarct (HCC)   . Neck pain   . Seasonal allergies   . Tachycardia     Patient Active Problem List   Diagnosis Date Noted  . STEMI involving left anterior descending coronary artery (HCC) 12/09/2018  . ST elevation myocardial infarction involving left anterior descending (LAD) coronary artery (HCC)   . Diastasis of muscle 12/30/2017  . Microscopic hematuria 01/28/2017  . GAD (generalized anxiety disorder) 08/24/2016  . Other fatigue 08/24/2016  . Other insomnia 08/24/2016  . Panic attack 08/24/2016  . Family history of polycystic kidney disease 03/25/2016  . Polycystic kidney disease 03/25/2016  . Postpartum bleeding  09/07/2014  . S/P D&C (status post dilation and curettage) 08/31/2014  . Breast engorgement, postpartum 08/04/2014  . Second-degree perineal laceration, with delivery 08/04/2014  . Allergy to penicillin 08/02/2014  . Hx of migraines 08/02/2014  . Vaginal delivery 08/02/2014    Past Surgical History:  Procedure Laterality Date  . CORONARY/GRAFT ACUTE MI REVASCULARIZATION N/A 12/09/2018   Procedure: CORONARY/GRAFT ACUTE MI REVASCULARIZATION;  Surgeon: Yvonne Kendall, MD;  Location: [facility] INVASIVE [facility];  Service: Cardiovascular;  Laterality: N/A;  . DILATION AND CURETTAGE OF UTERUS  08/31/2014   Procedure: Dilatation and Currettage with Ultrasound Guidance;  Surgeon: Sharon Seller, DO;  Location: [facility];  Service: Gynecology;;  . LEFT HEART CATH AND CORONARY ANGIOGRAPHY N/A 12/09/2018   Procedure: LEFT HEART CATH AND CORONARY ANGIOGRAPHY;  Surgeon: Yvonne Kendall, MD;  Location: [facility];  Service: Cardiovascular;  Laterality: N/A;  . WISDOM TOOTH EXTRACTION       OB History    Gravida  2   Para  2   Term  2   Preterm      AB      Living  2     SAB      TAB      Ectopic      Multiple      Live Births  2            Home Medications    Prior to Admission  medications   Medication Sig Start Date End Date Taking? Authorizing Provider  aspirin 81 MG EC tablet Take 1 tablet (81 mg total) by mouth daily. 12/21/18  Yes Corky Crafts, MD  clopidogrel (PLAVIX) 300 MG TABS tablet Take 300 mg by mouth once.   Yes [provider]  Melatonin 1 MG TABS Take 1 mg by mouth at bedtime as needed (sleep).   Yes [provider]  metoprolol tartrate (LOPRESSOR) 25 MG tablet Take 1 tablet (25 mg total) by mouth 2 (two) times daily. 12/21/18  Yes Corky Crafts, MD  Multiple Vitamin (MULTIVITAMIN WITH MINERALS) TABS tablet Take 1 tablet by mouth daily.   Yes [provider]    Family History Family History  Problem Relation Age of  Onset  . Breast cancer Maternal Grandmother   . Diabetes Father   . Kidney disease Father        transplant  . Heart disease Maternal Grandfather   . Cancer Maternal Grandfather        testicular  . Thyroid disease Mother     Social History Social History   Tobacco Use  . Smoking status: Never Smoker  . Smokeless tobacco: Never Used  Substance Use Topics  . Alcohol use: No  . Drug use: No     Allergies   Contrast media [iodinated diagnostic agents]; Loratadine; Morphine and related; and Penicillins   Review of Systems Review of Systems  Constitutional: Negative for fever.  Respiratory: Negative for cough and shortness of breath.   Cardiovascular: Negative for chest pain.  Gastrointestinal: Positive for abdominal pain. Negative for nausea and vomiting.  Genitourinary: Positive for vaginal bleeding. Negative for dysuria and hematuria.  Neurological: Negative for headaches.  All other systems reviewed and are negative.    Physical Exam Updated Vital Signs BP 109/75   Pulse 84   Temp 98 F (36.7 C) (Oral)   Resp 19   Ht 5\' 8"  (1.727 m)   Wt 64 kg   LMP 12/24/2018 (Exact Date)   SpO2 100%   BMI 21.44 kg/m   Physical Exam Vitals signs and nursing note reviewed. Exam conducted with a chaperone present.  Constitutional:      Appearance: Normal appearance. She is well-developed.  HENT:     Head: Normocephalic and atraumatic.  Eyes:     General: Lids are normal.     Conjunctiva/sclera: Conjunctivae normal.     Pupils: Pupils are equal, round, and reactive to light.     Comments: No pale conjunctive.   Neck:     Musculoskeletal: Full passive range of motion without pain.  Cardiovascular:     Rate and Rhythm: Normal rate and regular rhythm.     Pulses: Normal pulses.     Heart sounds: Normal heart sounds. No murmur. No friction rub. No gallop.   Pulmonary:     Effort: Pulmonary effort is normal.     Breath sounds: Normal breath sounds.  Abdominal:      Palpations: Abdomen is soft. Abdomen is not rigid.     Tenderness: There is abdominal tenderness in the suprapubic area. There is no guarding.     Comments: Abdomen is soft, nondistended.  Mild suprapubic abdominal tenderness.  No rigidity, guarding.  Genitourinary:    Vagina: Bleeding present.     Cervix: Cervical bleeding present.     Comments: The exam was performed with a chaperone present. Normal external female genitalia. No lesions, rash, or sores.  Blood noted in vaginal  vault with large amount of clots.  After clots removed and area was in active, there is no evidence of hemorrhage, laceration.  Bleeding appeared to be coming from cervical loss. Musculoskeletal: Normal range of motion.  Skin:    General: Skin is warm and dry.     Capillary Refill: Capillary refill takes less than 2 seconds.  Neurological:     Mental Status: She is alert and oriented to person, place, and time.  Psychiatric:        Speech: Speech normal.      ED Treatments / Results  Labs (all labs ordered are listed, but only abnormal results are displayed) Labs Reviewed  BASIC METABOLIC PANEL - Abnormal; Notable for the following components:      Result Value   Sodium 134 (*)    All other components within normal limits  URINALYSIS, ROUTINE W REFLEX MICROSCOPIC - Abnormal; Notable for the following components:   APPearance HAZY (*)    Specific Gravity, Urine <1.005 (*)    Hgb urine dipstick LARGE (*)    All other components within normal limits  URINALYSIS, MICROSCOPIC (REFLEX) - Abnormal; Notable for the following components:   Bacteria, UA RARE (*)    All other components within normal limits  CBC WITH DIFFERENTIAL/PLATELET  PREGNANCY, URINE    EKG EKG Interpretation  Date/Time:  Saturday December 24 2018 11:13:55 EST Ventricular Rate:  102 PR Interval:    QRS Duration: 96 QT Interval:  413 QTC Calculation: 538 R Axis:   42 Text Interpretation:  Sinus tachycardia Low voltage, precordial leads  RSR' in V1 or V2, probably normal variant Abnormal T, probable ischemia, widespread Prolonged QT interval Baseline wander in lead(s) I II aVR V5 V6 No significant change was found Confirmed by Azalia Bilis ([id number]) on 12/24/2018 12:59:57 PM   Radiology No results found.  Procedures Procedures (including critical care time)  Medications Ordered in ED Medications - No data to display   Initial Impression / Assessment and Plan / ED Course  I have reviewed the triage vital signs and the nursing notes.  Pertinent labs & imaging results that were available during my care of the patient were reviewed by me and considered in my medical decision making (see chart for details).        38 year old female who presents for evaluation of vaginal bleeding that began this morning.  She states that today is the normal time for her.  To start but is much heavier than normal.  She reports passing several clots since she started.  Patient recently had MI and was started on blood thinners.  She reports some lower abdominal cramping but otherwise denies any abdominal pain, nausea/vomiting. Patient is afebrile, non-toxic appearing, sitting comfortably on examination table. Vital signs reviewed and stable.  On exam, she has some mild suprapubic abdominal tenderness.  Suspect worsening bleeding is from use of blood thinners.  We will plan to do pelvic and check basic labs.  Pelvic exam as documented above.  No evidence of vaginal laceration, hemorrhage that would be contributing to bleeding.  Several clots were removed from the vaginal vault.  After evaluation, it appeared that bleeding was coming from cervical loss.   BMP is unremarkable.  CBC without any significant leukocytosis or anemia.  Hemoglobin is stable at 12.9.  UA negative for any infectious etiology.  Urine pregnancy negative.  Vital signs stable after being evaluated here in ED.  Hemoglobin is stable. Discussed patient with Dr. Patria Mane who agrees for  plan for discharge. Patient had ample opportunity for questions and discussion. All patient's questions were answered with full understanding. Strict return precautions discussed. Patient expresses understanding and agreement to plan.  Portions of this note were generated with Scientist, clinical (histocompatibility and immunogenetics). Dictation errors may occur despite best attempts at proofreading.    Final Clinical Impressions(s) / ED Diagnoses   Final diagnoses:  Vaginal bleeding    ED Discharge Orders    None       Rosana Hoes 12/25/18 1102    Azalia Bilis, MD 12/25/18 1147

## 2018-12-24 NOTE — ED Notes (Signed)
Assisted with pelvic exam, pt tolerated well.

## 2018-12-24 NOTE — ED Notes (Signed)
ED Provider at bedside. 

## 2018-12-24 NOTE — Telephone Encounter (Signed)
The patient's mother called the answering service after-hours today. H/o recent SCAD, discharged on DAPT. Now she has her period and is passing tennis ball size clots which is new. This is a bigger issue that needs ASAP evaluate for endometrial abnormality and exclude need for urgent procedure. Even if it were stopped DAPT would still be in system for 7 days and would still be at risk for continued bleeding. I advised she go to ER immediately, should not drive herself, and call EMS if feeling poorly. Mother verbalized understanding and gratitude. Tondalaya Perren PA-C

## 2018-12-24 NOTE — ED Notes (Signed)
Pt verbalizes understanding of discharge instructions and follow-up.  Ambulatory on discharge, family member provided transportation home.

## 2018-12-24 NOTE — ED Notes (Signed)
Pt unable to void at this time, aware of the need for a urine sample.

## 2018-12-24 NOTE — ED Triage Notes (Signed)
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 She has been taking numerous medications, including aspirin, plavix. She began her period today and went through 2 pads in a matter of minutes. Then sat on the couch and reports that blood just gushed out of her. Everytime she stands or changes position she gushes blood and has to change her pad. She is very tearful. She feels dizzy and lightheaded. BP is 124/100.

## 2018-12-25 ENCOUNTER — Telehealth: Payer: Self-pay | Admitting: Physician Assistant

## 2018-12-25 NOTE — Telephone Encounter (Signed)
   The patient's mother called the answering service back today. They called yesterday as the patient reported passing multiple tennis ball sized clots with period, which was unusual for her. She is now on ASA + Plavix due to history of recent SCAD with PTCA. She was advised to go to ER. Per ED notes, pelvic exam unremarkable and Hgb was stable. She was discharged home. Mother called back in today significantly concerned over further bleeding. I preliminarily reviewed situation with Dr. Delton See who discussed with Dr. Okey Dupre - since her event was several weeks ago, they both felt it was OK to stop the Plavix. I then called the patient's mother. She reports ongoing concern because the patient feels awful and did not sleep all night. She has had to wear Depends and saturating pad every hour because of ongoing bleeding. Her blood pressure this morning is 86 systolic, personally checked by mother, lower than in ED visit yesterday. I relayed recommendation to stop Plavix but at this point it is still likely to be in her system another few days and it concerns me that the patient has had incessant bleeding and new hypotension as well. Yesterday they were advised to return to ER if persistent issues, lightheadedness or change in status so we discussed returning to the ER. They will proceed.  Laurann Montana

## 2018-12-27 ENCOUNTER — Ambulatory Visit: Payer: 59 | Admitting: Cardiology

## 2019-01-10 ENCOUNTER — Telehealth: Payer: Self-pay | Admitting: Interventional Cardiology

## 2019-01-10 NOTE — Telephone Encounter (Signed)
Returned call to patient. 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 She statees that she was not doing any heavy lifting. She states that she developed left sided chest discomfort that she rated 4/10. She states that this discomfort has been constant and tender to touch. She denies SOB, radiation, N/V, lightheadedness, dizziness, or any other Sx. She states that it is not like when she had her SCAD. She states that she already stopped her lipitor and plavix. Patient still continues with ASA 81 mg QD. Patient  Has been taking metoprolol 25 mg BID. Patient states BP has been around 96/61 HR 86. Patient states that she has been having a lot of anxiety lately. Patient is requesting an Rx just in case she needs it. She states that she does not feel like she needs it now but just wants to have some just in case.

## 2019-01-10 NOTE — Telephone Encounter (Signed)
New Message:   Pt wanted to know if she should have a prescription for Nitroglycerin called in, so she can have it on her at all times?

## 2019-01-11 MED ORDER — NITROGLYCERIN 0.4 MG SL SUBL: 0.4000 mg | SUBLINGUAL_TABLET | SUBLINGUAL | 0 refills | Status: DC | PRN

## 2019-01-11 NOTE — Telephone Encounter (Signed)
OK to use have NTG Rx.  WOuld be cautious to only use when she is siting or lying down given her BP.

## 2019-01-11 NOTE — Telephone Encounter (Signed)
Called and made patient aware of recommendations below. Patient verbalized understanding and thanked me for the call. 

## 2019-01-24 ENCOUNTER — Telehealth: Payer: Self-pay | Admitting: Interventional Cardiology

## 2019-01-24 NOTE — Telephone Encounter (Signed)
New Message   Pt c/o medication issue:  1. Name of Medication: Aspirin 81mg   2. How are you currently taking this medication (dosage and times per day)? 1 tablet once a day   3. Are you having a reaction (difficulty breathing--STAT)? NO  4. What is your medication issue? Patient states she suffers from Chronic headaches and wants to know if it would be ok to stop taking her Aspirin and take an advil instead when she has a headache.

## 2019-01-25 NOTE — Telephone Encounter (Signed)
Left message for patient to call back  

## 2019-01-26 NOTE — Telephone Encounter (Signed)
Called and instructed patient to continue taking ASA 81 mg QD. Instructed patient to take Tylenol for her Has and avoid advil and other NSAIDs to decrease risk of bleeding. Patient verbalized understanding and thanked me for the call.

## 2019-01-30 ENCOUNTER — Telehealth: Payer: Self-pay

## 2019-01-30 NOTE — Telephone Encounter (Signed)
Virtual Visit Pre-Appointment Phone Call  Steps For Call:  1. Confirm consent - "In the setting of the current Covid19 crisis, you are scheduled for a (phone or video) visit with your provider on (date) at (time).  Just as we do with many in-office visits, in order for you to participate in this visit, we must obtain consent.  If you'd like, I can send this to your mychart (if signed up) or email for you to review.  Otherwise, I can obtain your verbal consent now.  All virtual visits are billed to your insurance company just like a normal visit would be.  By agreeing to a virtual visit, we'd like you to understand that the technology does not allow for your provider to perform an examination, and thus may limit your provider's ability to fully assess your condition.  Finally, though the technology is pretty good, we cannot assure that it will always work on either your or our end, and in the setting of a video visit, we may have to convert it to a phone-only visit.  In either situation, we cannot ensure that we have a secure connection.  Are you willing to proceed?"  2. Give patient instructions for WebEx download to smartphone as below if video visit  3. Advise patient to be prepared with any vital sign or heart rhythm information, their current medicines, and a piece of paper and pen handy for any instructions they may receive the day of their visit  4. Inform patient they will receive a phone call 15 minutes prior to their appointment time (may be from unknown caller ID) so they should be prepared to answer  5. Confirm that appointment type is correct in Epic appointment notes (video vs telephone)    TELEPHONE CALL NOTE  Brezlyn Troupe has been deemed a candidate for a follow-up tele-health visit to limit community exposure during the Covid-19 pandemic. I spoke with the patient via phone to ensure availability of phone/video source, confirm preferred email & phone number, and discuss  instructions and expectations.  I reminded Flossy Weisner to be prepared with any vital sign and/or heart rhythm information that could potentially be obtained via home monitoring, at the time of her visit. I reminded Ibuk[place]wa Leventis to expect a phone call at the time of her visit if her visit.  Did the patient verbally acknowledge consent to treatment? YES  Patient agrees to VIDEO Visit via [email or web address] with Dr. Eldridge Dace on 4/15  Lattie Haw, RN 01/30/2019 2:13 PM   CONSENT FOR TELE-HEALTH VISIT - PLEASE REVIEW  I hereby voluntarily request, consent and authorize [facility] and its employed or contracted physicians, physician assistants, nurse practitioners or other licensed health care professionals (the Practitioner), to provide me with telemedicine health care services (the Services") as deemed necessary by the treating Practitioner. I acknowledge and consent to receive the Services by the Practitioner via telemedicine. I understand that the telemedicine visit will involve communicating with the Practitioner through live audiovisual communication technology and the disclosure of certain medical information by electronic transmission. I acknowledge that I have been given the opportunity to request an in-person assessment or other available alternative prior to the telemedicine visit and am voluntarily participating in the telemedicine visit.  I understand that I have the right to withhold or withdraw my consent to the use of telemedicine in the course of my care at any time, without affecting my right to future care or treatment, and that the Practitioner or I  may terminate the telemedicine visit at any time. I understand that I have the right to inspect all information obtained and/or recorded in the course of the telemedicine visit and may receive copies of available information for a reasonable fee.  I understand that some of the potential risks of receiving the Services via telemedicine  include:   Delay or interruption in medical evaluation due to technological equipment failure or disruption;  Information transmitted may not be sufficient (e.g. poor resolution of images) to allow for appropriate medical decision making by the Practitioner; and/or   In rare instances, security protocols could fail, causing a breach of personal health information.  Furthermore, I acknowledge that it is my responsibility to provide information about my medical history, conditions and care that is complete and accurate to the best of my ability. I acknowledge that Practitioner's advice, recommendations, and/or decision may be based on factors not within their control, such as incomplete or inaccurate data provided by me or distortions of diagnostic images or specimens that may result from electronic transmissions. I understand that the practice of medicine is not an exact science and that Practitioner makes no warranties or guarantees regarding treatment outcomes. I acknowledge that I will receive a copy of this consent concurrently upon execution via email to the email address I last provided but may also request a printed copy by calling the office of [facility].    I understand that my insurance will be billed for this visit.   I have read or had this consent read to me.  I understand the contents of this consent, which adequately explains the benefits and risks of the Services being provided via telemedicine.   I have been provided ample opportunity to ask questions regarding this consent and the Services and have had my questions answered to my satisfaction.  I give my informed consent for the services to be provided through the use of telemedicine in my medical care  By participating in this telemedicine visit I agree to the above.

## 2019-02-01 ENCOUNTER — Emergency Department (HOSPITAL_BASED_OUTPATIENT_CLINIC_OR_DEPARTMENT_OTHER)
Admission: EM | Admit: 2019-02-01 | Discharge: 2019-02-01 | Disposition: A | Discharge status: Home / Self Care | Payer: 59 | Attending: Emergency Medicine | Admitting: Emergency Medicine

## 2019-02-01 ENCOUNTER — Encounter (HOSPITAL_BASED_OUTPATIENT_CLINIC_OR_DEPARTMENT_OTHER): Payer: Self-pay | Admitting: Emergency Medicine

## 2019-02-01 ENCOUNTER — Emergency Department (HOSPITAL_BASED_OUTPATIENT_CLINIC_OR_DEPARTMENT_OTHER): Payer: 59

## 2019-02-01 ENCOUNTER — Other Ambulatory Visit: Payer: Self-pay

## 2019-02-01 ENCOUNTER — Encounter: Payer: Self-pay | Admitting: Interventional Cardiology

## 2019-02-01 ENCOUNTER — Telehealth (INDEPENDENT_AMBULATORY_CARE_PROVIDER_SITE_OTHER): Payer: 59 | Admitting: Interventional Cardiology

## 2019-02-01 DIAGNOSIS — R0789 Other chest pain: Secondary | ICD-10-CM | POA: Diagnosis not present

## 2019-02-01 DIAGNOSIS — R5383 Other fatigue: Secondary | ICD-10-CM | POA: Diagnosis not present

## 2019-02-01 DIAGNOSIS — I2542 Coronary artery dissection: Secondary | ICD-10-CM

## 2019-02-01 DIAGNOSIS — Z7982 Long term (current) use of aspirin: Secondary | ICD-10-CM | POA: Diagnosis not present

## 2019-02-01 DIAGNOSIS — Z7902 Long term (current) use of antithrombotics/antiplatelets: Secondary | ICD-10-CM | POA: Insufficient documentation

## 2019-02-01 DIAGNOSIS — Z79899 Other long term (current) drug therapy: Secondary | ICD-10-CM | POA: Insufficient documentation

## 2019-02-01 DIAGNOSIS — I252 Old myocardial infarction: Secondary | ICD-10-CM | POA: Insufficient documentation

## 2019-02-01 DIAGNOSIS — R079 Chest pain, unspecified: Secondary | ICD-10-CM | POA: Diagnosis present

## 2019-02-01 DIAGNOSIS — F419 Anxiety disorder, unspecified: Secondary | ICD-10-CM

## 2019-02-01 LAB — COMPREHENSIVE METABOLIC PANEL
ALT: 34 U/L (ref 0–44)
AST: 35 U/L (ref 15–41)
Albumin: 3.8 g/dL (ref 3.5–5.0)
Alkaline Phosphatase: 109 U/L (ref 38–126)
Anion gap: 6 (ref 5–15)
BUN: 12 mg/dL (ref 6–20)
CO2: 26 mmol/L (ref 22–32)
Calcium: 8.6 mg/dL — ABNORMAL LOW (ref 8.9–10.3)
Chloride: 104 mmol/L (ref 98–111)
Creatinine, Ser: 0.67 mg/dL (ref 0.44–1.00)
GFR calc Af Amer: >60 mL/min (ref >60)
GFR calc non Af Amer: >60 mL/min (ref >60)
Glucose, Bld: 101 mg/dL — ABNORMAL HIGH (ref 70–99)
Potassium: 3.9 mmol/L (ref 3.5–5.1)
Sodium: 136 mmol/L (ref 135–145)
Total Bilirubin: 0.7 mg/dL (ref 0.3–1.2)
Total Protein: 7.2 g/dL (ref 6.5–8.1)

## 2019-02-01 LAB — CBC WITH DIFFERENTIAL/PLATELET
Abs Immature Granulocytes: 0 10*3/uL (ref 0.00–0.07)
Basophils Absolute: 0.1 10*3/uL (ref 0.0–0.1)
Basophils Relative: 1 %
Eosinophils Absolute: 0.2 10*3/uL (ref 0.0–0.5)
Eosinophils Relative: 3 %
HCT: 37.6 % (ref 36.0–46.0)
Hemoglobin: 11.6 g/dL — ABNORMAL LOW (ref 12.0–15.0)
Immature Granulocytes: 0 %
Lymphocytes Relative: 29 %
Lymphs Abs: 1.6 10*3/uL (ref 0.7–4.0)
MCH: 26.8 pg (ref 26.0–34.0)
MCHC: 30.9 g/dL (ref 30.0–36.0)
MCV: 86.8 fL (ref 80.0–100.0)
Monocytes Absolute: 0.9 10*3/uL (ref 0.1–1.0)
Monocytes Relative: 16 %
Neutro Abs: 2.7 10*3/uL (ref 1.7–7.7)
Neutrophils Relative %: 51 %
Platelets: 289 10*3/uL (ref 150–400)
RBC: 4.33 MIL/uL (ref 3.87–5.11)
RDW: 12.5 % (ref 11.5–15.5)
WBC: 5.4 10*3/uL (ref 4.0–10.5)
nRBC: 0 % (ref 0.0–0.2)

## 2019-02-01 LAB — C-REACTIVE PROTEIN: CRP: <0.8 mg/dL (ref <1.0)

## 2019-02-01 LAB — SEDIMENTATION RATE: Sed Rate: 9 mm/hr (ref 0–22)

## 2019-02-01 LAB — TROPONIN I
Troponin I: <0.03 ng/mL (ref <0.03)
Troponin I: <0.03 ng/mL (ref <0.03)

## 2019-02-01 MED ORDER — ASPIRIN 81 MG PO CHEW
243.0000 mg | CHEWABLE_TABLET | Freq: Once | ORAL | Status: AC
  Administered 2019-02-01: 09:00:00 243 mg via ORAL
  Filled 2019-02-01: qty 3

## 2019-02-01 MED ORDER — NITROGLYCERIN 0.4 MG SL SUBL
0.4000 mg | SUBLINGUAL_TABLET | SUBLINGUAL | Status: DC | PRN
  Administered 2019-02-01: 0.4 mg via SUBLINGUAL
  Filled 2019-02-01: qty 1

## 2019-02-01 NOTE — Progress Notes (Signed)
Virtual Visit via Video Note   This visit type was conducted due to national recommendations for restrictions regarding the COVID-19 Pandemic (e.g. social distancing) in an effort to limit this patient's exposure and mitigate transmission in our community.  Due to her co-morbid illnesses, this patient is at least at moderate risk for complications without adequate follow up.  This format is felt to be most appropriate for this patient at this time.  All issues noted in this document were discussed and addressed.  A limited physical exam was performed with this format.  Please refer to the patient's chart for her consent to telehealth for [facility].   Evaluation Performed:  Follow-up visit  Date:  02/01/2019   ID:  Stephanie Wagner, DOB 10/22/1980, MRN [id number]  Patient was in [facility] Provider Location: Home  PCP:  Laqueta Due., MD  Cardiologist:  No primary care provider on file. Rosha Cocker Electrophysiologist:  None   Chief Complaint:  H/o SCAD  History of Present Illness:    Stephanie Wagner is a 38 y.o. female with  Who had an LAD dissection (SCAD).  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 Normal left ventricular filling pressure. Balloon angioplasty to distal LAD with reestablishment of flow. The apical LAD remains occluded. Possible IV contrast reaction, improved with administration of methylprednisolone 125 mg x 1 and diphenhydramine 50 mg x 1.  Echo before discharge showed: Left Ventricle: Definity contrast agent was given IV to delineate the left ventricular endocardial borders. Limited echo with Definity to assess LV wall motion. There is apical akinesis, EF 50%. No thrombus noted.  Occsaional sharp chest pain.  She has had fatigue.    She has been anxiety with the MI and the corona virus.  On 01/28/2019, she was bending down to pick something up. She has had a sharp pain for 4 days  that radiates to her back.    She had some pain with carrying her 40 year old up the stairs.    She tried to avoid seeking medical attention due to the COVID-19 outbreak but since her symptoms persisted, she wanted to make sure nothing was wrong.  She went to the emergency room this morning.  She has thus far had a negative work-up.  The patient does not have symptoms concerning for COVID-19 infection (fever, chills, cough, or new shortness of breath).    Past Medical History:  Diagnosis Date  . Back pain   . Chronic headaches   . Environmental allergies    animals, dander  . Migraine   . Myocardial infarct (HCC)   . Neck pain   . Seasonal allergies   . Tachycardia    Past Surgical History:  Procedure Laterality Date  . CORONARY/GRAFT ACUTE MI REVASCULARIZATION N/A 12/09/2018   Procedure: CORONARY/GRAFT ACUTE MI REVASCULARIZATION;  Surgeon: Yvonne Kendall, MD;  [facility];  Service: Cardiovascular;  Laterality: N/A;  . DILATION AND CURETTAGE OF UTERUS  08/31/2014   Procedure: Dilatation and Currettage with Ultrasound Guidance;  Surgeon: Sharon Seller, DO;  Location: [facility];  Service: Gynecology;;  . LEFT HEART CATH AND CORONARY ANGIOGRAPHY N/A 12/09/2018   Procedure: LEFT HEART CATH AND CORONARY ANGIOGRAPHY;  Surgeon: Yvonne Kendall, MD;  [facility];  Service: Cardiovascular;  Laterality: N/A;  . WISDOM TOOTH EXTRACTION       Current Meds  Medication Sig  . aspirin 81 MG EC tablet Take  1 tablet (81 mg total) by mouth daily.  . Melatonin 1 MG TABS Take 1 mg by mouth at bedtime as needed (sleep).  . metoprolol tartrate (LOPRESSOR) 25 MG tablet Take 1 tablet (25 mg total) by mouth 2 (two) times daily.  . Multiple Vitamin (MULTIVITAMIN WITH MINERALS) TABS tablet Take 1 tablet by mouth daily.  . nitroGLYCERIN (NITROSTAT) 0.4 MG SL tablet Place 1 tablet (0.4 mg total) under the tongue every 5 (five) minutes as needed for chest pain.      Allergies:   Contrast media [iodinated diagnostic agents]; Loratadine; Morphine and related; and Penicillins   Social History   Tobacco Use  . Smoking status: Never Smoker  . Smokeless tobacco: Never Used  Substance Use Topics  . Alcohol use: No  . Drug use: No     Family Hx: The patient's family history includes Breast cancer in her maternal grandmother; Cancer in her maternal grandfather; Diabetes in her father; Heart disease in her maternal grandfather; Kidney disease in her father; Thyroid disease in her mother.  ROS:   Please see the history of present illness.    Chest pain All other systems reviewed and are negative.   Prior CV studies:   The following studies were reviewed today: ECG and labs from ER today    Labs/Other Tests and Data Reviewed:    EKG:  ECG: NSR, improved T wave inversions laterally  Recent Labs: 02/01/2019: ALT 34; BUN 12; Creatinine, Ser 0.67; Hemoglobin 11.6; Platelets 289; Potassium 3.9; Sodium 136   Recent Lipid Panel Lab Results  Component Value Date/Time   CHOL 127 12/10/2018 05:16 AM   TRIG 42 12/10/2018 05:16 AM   HDL 51 12/10/2018 05:16 AM   CHOLHDL 2.5 12/10/2018 05:16 AM   LDLCALC 68 12/10/2018 05:16 AM    Wt Readings from Last 3 Encounters:  02/01/19 143 lb (64.9 kg)  02/01/19 143 lb (64.9 kg)  12/24/18 141 lb (64 kg)     Objective:    Vital Signs:  BP 116/83   Pulse 83   Ht  (1.727 m)   Wt 143 lb (64.9 kg)   LMP 01/25/2019   BMI 21.74 kg/m    Well nourished, well developed female in no acute distress. Wearing mask No shortness of breath  Exam limited by video format;ER monitor was visualized  ASSESSMENT & PLAN:    1. SCAD: Chest pain.  Atypical.  Sounds more MSK in origin.  Negative troponin.  ECG improved.  Encouraged cardio exercise but she should avoid exercise that involves straining.  2. Fatigue: Better with less metoprolol.   3. Anxiety: She was asking if there was any medications she could try for  anxiety.  I asked her to discuss SSRIs with her primary care doctor.  We did talk about the downsides of using a benzodiazepine.  She would prefer not to have to take any medications for anxiety.  Understandably, she still has thoughts about when another scad event may happen.  COVID-19 Education: The signs and symptoms of COVID-19 were discussed with the patient and how to seek care for testing (follow up with PCP or arrange E-visit).  The importance of social distancing was discussed today.  Time:   Today, I have spent 25 minutes with the patient with telehealth technology discussing the above problems.     Medication Adjustments/Labs and Tests Ordered: Current medicines are reviewed at length with the patient today.  Concerns regarding medicines are outlined above.   Tests Ordered: No  orders of the defined types were placed in this encounter.   Medication Changes: No orders of the defined types were placed in this encounter.   Disposition:  Follow up in 4 month(s)  Signed, Lance MussJayadeep Jayleene Glaeser, MD  02/01/2019 11:19 AM    [facility]

## 2019-02-01 NOTE — Patient Instructions (Addendum)
Medication Instructions:  Your physician recommends that you continue on your current medications as directed. Please refer to the Current Medication list given to you today.  If you need a refill on your cardiac medications before your next appointment, please call your pharmacy.   Lab work: None Ordered  If you have labs (blood work) drawn today and your tests are completely normal, you will receive your results only by: Marland Kitchen MyChart Message (if you have MyChart) OR . A paper copy in the mail If you have any lab test that is abnormal or we need to change your treatment, we will call you to review the results.  Testing/Procedures: None ordered  Follow-Up: . Follow up with Dr. Eldridge Dace on 05/29/19 at 2:20 PM  Any Other Special Instructions Will Be Listed Below (If Applicable).

## 2019-02-01 NOTE — Discharge Instructions (Signed)
Continue your current meds   Consider talking to your primary care doctor about medicines for anxiety   See cardiology for follow up   Return to ER if you have worse chest pain, shortness of breath.

## 2019-02-01 NOTE — ED Triage Notes (Signed)
Pt reports sharp , left chest pain  Radiating to back, denies shortness of breath, nor NV. MI on 2/21. Alert and oriented x 4.

## 2019-02-01 NOTE — ED Provider Notes (Signed)
[place] EMERGENCY DEPARTMENT Provider Note   CSN: [id number] Arrival date & time: 02/01/19  0820    History   Chief Complaint Chief Complaint  Patient presents with  . Chest Pain    HPI Stephanie Wagner is a 38 y.o. female history of MI recently here presenting with chest pain.  Patient states that she had a heart attack in February and she actually had angioplasty and has been on Plavix and aspirin.  She states that she has intermittent pain for the last several months but for the last 4 days, she states the pain is worse when she bends over.  It is a pressure sensation that radiates to her back as well.  Patient states that she did take her aspirin this morning but it did not help with the pain so she came here for evaluation.  Has any shortness of breath or fever.  Denies any sick contacts and she has been under a lot of stress has been home with the kids as well.     The history is provided by the patient.    Past Medical History:  Diagnosis Date  . Back pain   . Chronic headaches   . Environmental allergies    animals, dander  . Migraine   . Myocardial infarct ([place])   . Neck pain   . Seasonal allergies   . Tachycardia     Patient Active Problem List   Diagnosis Date Noted  . STEMI involving left anterior descending coronary artery ([place]) 12/09/2018  . ST elevation myocardial infarction involving left anterior descending (LAD) coronary artery ([place])   . Diastasis of muscle 12/30/2017  . Microscopic hematuria 01/28/2017  . GAD (generalized anxiety disorder) 08/24/2016  . Other fatigue 08/24/2016  . Other insomnia 08/24/2016  . Panic attack 08/24/2016  . Family history of polycystic kidney disease 03/25/2016  . Polycystic kidney disease 03/25/2016  . Postpartum bleeding 09/07/2014  . S/P D&C (status post dilation and curettage) 08/31/2014  . Breast engorgement, postpartum 08/04/2014  . Second-degree perineal laceration, with delivery 08/04/2014  .  Allergy to penicillin 08/02/2014  . Hx of migraines 08/02/2014  . Vaginal delivery 08/02/2014    Past Surgical History:  Procedure Laterality Date  . CORONARY/GRAFT ACUTE MI REVASCULARIZATION N/A 12/09/2018   Procedure: CORONARY/GRAFT ACUTE MI REVASCULARIZATION;  Surgeon: Nelva Bush, MD;  Location: [place] [facility];  Service: Cardiovascular;  Laterality: N/A;  . DILATION AND CURETTAGE OF UTERUS  08/31/2014   Procedure: Dilatation and Currettage with Ultrasound Guidance;  Surgeon: Annalee Genta, DO;  Location: [place] [facility];  Service: Gynecology;;  . LEFT HEART CATH AND CORONARY ANGIOGRAPHY N/A 12/09/2018   Procedure: LEFT HEART CATH AND CORONARY ANGIOGRAPHY;  Surgeon: Nelva Bush, MD;  Location: [place] [facility];  Service: Cardiovascular;  Laterality: N/A;  . WISDOM TOOTH EXTRACTION       OB History    Gravida  2   Para  2   Term  2   Preterm      AB      Living  2     SAB      TAB      Ectopic      Multiple      Live Births  2            Home Medications    Prior to Admission medications   Medication Sig Start Date End Date Taking? Authorizing Provider  aspirin 81 MG EC tablet Take 1 tablet (81  mg total) by mouth daily. 12/21/18   Jettie Booze, MD  clopidogrel (PLAVIX) 300 MG TABS tablet Take 300 mg by mouth once.    [provider]  Melatonin 1 MG TABS Take 1 mg by mouth at bedtime as needed (sleep).    [provider]  metoprolol tartrate (LOPRESSOR) 25 MG tablet Take 1 tablet (25 mg total) by mouth 2 (two) times daily. 12/21/18   Jettie Booze, MD  Multiple Vitamin (MULTIVITAMIN WITH MINERALS) TABS tablet Take 1 tablet by mouth daily.    [provider]  nitroGLYCERIN (NITROSTAT) 0.4 MG SL tablet Place 1 tablet (0.4 mg total) under the tongue every 5 (five) minutes as needed for chest pain. 01/11/19   Jettie Booze, MD    Family History Family History  Problem Relation Age of Onset  . Breast cancer  Maternal Grandmother   . Diabetes Father   . Kidney disease Father        transplant  . Heart disease Maternal Grandfather   . Cancer Maternal Grandfather        testicular  . Thyroid disease Mother     Social History Social History   Tobacco Use  . Smoking status: Never Smoker  . Smokeless tobacco: Never Used  Substance Use Topics  . Alcohol use: No  . Drug use: No     Allergies   Contrast media [iodinated diagnostic agents]; Loratadine; Morphine and related; and Penicillins   Review of Systems Review of Systems  Cardiovascular: Positive for chest pain.  All other systems reviewed and are negative.    Physical Exam Updated Vital Signs BP (!) 130/108   Pulse (!) 101   Temp 98.2 F (36.8 C) (Oral)   Resp 18   Ht _0  (1.727 m)   Wt 64.9 kg   LMP 01/25/2019   SpO2 100%   BMI 21.74 kg/m   Physical Exam Vitals signs and nursing note reviewed.  Constitutional:      Appearance: She is well-developed.  HENT:     Head: Normocephalic.  Eyes:     Extraocular Movements: Extraocular movements intact.     Pupils: Pupils are equal, round, and reactive to light.  Neck:     Musculoskeletal: Normal range of motion and neck supple.  Cardiovascular:     Rate and Rhythm: Normal rate and regular rhythm.     Heart sounds: Normal heart sounds.     Comments: No obvious murmurs or friction rubs  Pulmonary:     Effort: Pulmonary effort is normal.     Breath sounds: Normal breath sounds.  Abdominal:     General: Bowel sounds are normal.     Palpations: Abdomen is soft.  Musculoskeletal: Normal range of motion.  Skin:    General: Skin is warm.     Capillary Refill: Capillary refill takes less than 2 seconds.  Neurological:     General: No focal deficit present.     Mental Status: She is alert and oriented to person, place, and time.  Psychiatric:        Mood and Affect: Mood normal.        Behavior: Behavior normal.      ED Treatments / Results  Labs (all labs  ordered are listed, but only abnormal results are displayed) Labs Reviewed  CBC WITH DIFFERENTIAL/PLATELET  COMPREHENSIVE METABOLIC PANEL  TROPONIN I  SEDIMENTATION RATE  C-REACTIVE PROTEIN    EKG EKG Interpretation  Date/Time:  Wednesday February 01 2019 08:32:26  EDT Ventricular Rate:  95 PR Interval:    QRS Duration: 97 QT Interval:  364 QTC Calculation: 458 R Axis:   28 Text Interpretation:  Sinus rhythm Nonspecific T abnrm, anterolateral leads TWI improved from previous EKG  Confirmed by Wandra Arthurs [phone]) on 02/01/2019 8:38:34 AM   Radiology No results found.  Procedures Procedures (including critical care time)  Medications Ordered in ED Medications  aspirin chewable tablet 243 mg (has no administration in time range)     Initial Impression / Assessment and Plan / ED Course  I have reviewed the triage vital signs and the nursing notes.  Pertinent labs & imaging results that were available during my care of the patient were reviewed by me and considered in my medical decision making (see chart for details).       Stephanie Wagner is a 38 y.o. female here with chest pain. Worse with leaning over and worse with stress. Recent anioplasty and is on ASA, plavix. Consider unstable angina, also consider pericarditis. Will get labs, ESR, CRP, CXR.   10 AM  ESR normal. Trop neg x 1. I talked to cardiologist on call. Patient actually has telehealth visit with Dr. Irish Lack in an hour so will keep her here until Dr. Irish Lack talks to her.   12:23 PM Dr. Irish Lack saw patient and thought likely MSK in origin. Pain free after ASA, nitro. Second troponin negative. Dr. Irish Lack discussed medicines for anxiety and patient will talk with PCP about that. Stable for discharge.    Final Clinical Impressions(s) / ED Diagnoses   Final diagnoses:  None    ED Discharge Orders    None       Drenda Freeze, MD 02/01/19 1224

## 2019-02-09 ENCOUNTER — Ambulatory Visit: Payer: 59 | Admitting: Interventional Cardiology

## 2019-03-30 ENCOUNTER — Telehealth: Payer: Self-pay | Admitting: Interventional Cardiology

## 2019-03-30 NOTE — Telephone Encounter (Signed)
New Message     Pt says she has some questions about exercising.  She wants to know what she is allowed to do, she says she has done some cardio but she has noticed she will get some Chest Pain after She wants to make sure she is not doing too much or if she should do more    Please call back

## 2019-03-30 NOTE — Telephone Encounter (Signed)
SHe has had some difficulty tolerating meds.  She can continue to be physically active as tolerated; if she feels she is overdoing it, she should slow down.  Safe for sexual intercourse as well.  Heart function is normal based on last echo.    WOuld continue to work on anxiety with deep breathing.  Can check with PMD if some medicine would be helpful.

## 2019-03-30 NOTE — Telephone Encounter (Signed)
Left message for patient to call back  

## 2019-03-30 NOTE — Telephone Encounter (Signed)
Received return call from patient She states she is concerned about chest pain that occurs after exercise Reports that she feels a dull pain but that is always present since d/c from hospital  In addition to walking she has resumed a cardio dance routine that she used to do prior to hospitalization. States pain improves with rest although reports she does not get a lot of rest during the day due to 2 young children at home Denies s/s of MSK pain, reports it is not reproducible and is not affected by movement. States it feels more like a heaviness Drinks 94 oz water daily and reports that she eats a healthy diet Reports HR monitor show HR consistently >90 bpm except when she first wakes up in the morning. She decreased her metoprolol a few months ago due to fatigue and hypotension. I spent 20 minutes talking with her and we discussed her anxiety associated with having another spontaneous dissection. We discussed normal range of HR for a person her age as she admits to increased anxiety when HR goes up to 120 bpm. She also requests advice regarding when to resume sexual intercourse. I encouraged her to continue walking and being active around the house unless she develops chest discomfort and that I will forward message to Dr. Irish Lack for additional advice. She was grateful for my help and is aware our office will call back with additional recommendations from Dr. Irish Lack. She verbalized understanding and agreement.

## 2019-03-31 NOTE — Telephone Encounter (Signed)
Left message for patient to call back  

## 2019-03-31 NOTE — Telephone Encounter (Signed)
Called and made patient aware of recommendations below. 

## 2019-05-28 NOTE — Progress Notes (Signed)
Cardiology Office Note   Date:  05/29/2019   ID:  Stephanie Wagner, DOB [birth year], MRN [id number]  PCP:  Laqueta DueFurr, Stephanie M., MD    No chief complaint on file.  SCAD  Wt Readings from Last 3 Encounters:  05/29/19 145 lb 3.2 oz (65.9 kg)  02/01/19 143 lb (64.9 kg)  02/01/19 143 lb (64.9 kg)       History of Present Illness: Stephanie Wagner is a 38 y.o. female  Who had an LAD dissection (SCAD). 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 Normal left ventricular filling pressure. Balloon angioplasty to distal LAD with reestablishment of flow. The apical LAD remains occluded. Possible IV contrast reaction, improved with administration of methylprednisolone 125 mg x 1 and diphenhydramine 50 mg x 1.  Echo before discharge showed: Left Ventricle: Definity contrast agent was given IV to delineate the left ventricular endocardial borders. Limited echo with Definity to assess LV wall motion. There is apical akinesis, EF 50%. No thrombus noted.  Since hospital discharge, she had Occsaional sharp chest pain. She has had fatigue.   She has been anxious with the MI and the corona virus, but this has improved.  Resting HR is in the 75-85 range.      Past Medical History:  Diagnosis Date  . Back pain   . Chronic headaches   . Environmental allergies    animals, dander  . Migraine   . Myocardial infarct ([facility])   . Neck pain   . Seasonal allergies   . Tachycardia     Past Surgical History:  Procedure Laterality Date  . CORONARY/GRAFT ACUTE MI REVASCULARIZATION N/A 12/09/2018   Procedure: CORONARY/GRAFT ACUTE MI REVASCULARIZATION;  Surgeon: Yvonne KendallEnd, Christopher, MD;  [facility]: [facility];  Service: Cardiovascular;  Laterality: N/A;  . DILATION AND CURETTAGE OF UTERUS  08/31/2014   Procedure: Dilatation and Currettage with Ultrasound Guidance;  Surgeon: Sharon SellerJennifer M Ozan, DO;  Location: [facility];  Service:  Gynecology;;  . LEFT HEART CATH AND CORONARY ANGIOGRAPHY N/A 12/09/2018   Procedure: LEFT HEART CATH AND CORONARY ANGIOGRAPHY;  Surgeon: Yvonne KendallEnd, Christopher, MD;  [facility]: [facility];  Service: Cardiovascular;  Laterality: N/A;  . WISDOM TOOTH EXTRACTION       Current Outpatient Medications  Medication Sig Dispense Refill  . aspirin 81 MG EC tablet Take 1 tablet (81 mg total) by mouth daily. 90 tablet 3  . Melatonin 1 MG TABS Take 1 mg by mouth at bedtime as needed (sleep).    . metoprolol tartrate (LOPRESSOR) 25 MG tablet Take 1 tablet (25 mg total) by mouth 2 (two) times daily. 180 tablet 3  . Multiple Vitamin (MULTIVITAMIN WITH MINERALS) TABS tablet Take 1 tablet by mouth daily.    . nitroGLYCERIN (NITROSTAT) 0.4 MG SL tablet Place 1 tablet (0.4 mg total) under the tongue every 5 (five) minutes as needed for chest pain. 25 tablet 0   No current facility-administered medications for this visit.     Allergies:   Contrast media [iodinated diagnostic agents], Loratadine, Morphine and related, and Penicillins    Social History:  The patient  reports that she has never smoked. She has never used smokeless tobacco. She reports that she does not drink alcohol or use drugs.   Family History:  The patient's family history includes Breast cancer in her maternal grandmother; Cancer in her maternal grandfather; Diabetes in her father; Heart disease in her maternal grandfather; Kidney disease  in her father; Thyroid disease in her mother.    ROS:  Please see the history of present illness.   Otherwise, review of systems are positive for some fatigue after vigorous exercise.   All other systems are reviewed and negative.    PHYSICAL EXAM: VS:  BP 114/80   Pulse (!) 103   Ht 5\' 8"  (1.727 m)   Wt 145 lb 3.2 oz (65.9 kg)   SpO2 99%   BMI 22.08 kg/m  , BMI Body mass index is 22.08 kg/m. GEN: Well nourished, well developed, in no acute distress  HEENT: normal  Neck: no JVD, carotid bruits,  or masses Cardiac: RRR; no murmurs, rubs, or gallops,no edema  Respiratory:  clear to auscultation bilaterally, normal work of breathing GI: soft, nontender, nondistended, + BS MS: no deformity or atrophy  Skin: warm and dry, no rash Neuro:  Strength and sensation are intact Psych: euthymic mood, full affect; mood improved compared to prior visit.    Recent Labs: 02/01/2019: ALT 34; BUN 12; Creatinine, Ser 0.67; Hemoglobin 11.6; Platelets 289; Potassium 3.9; Sodium 136   Lipid Panel    Component Value Date/Time   CHOL 127 12/10/2018 0516   TRIG 42 12/10/2018 0516   HDL 51 12/10/2018 0516   CHOLHDL 2.5 12/10/2018 0516   VLDL 8 12/10/2018 0516   LDLCALC 68 12/10/2018 0516     Other studies Reviewed: Additional studies/ records that were reviewed today with results demonstrating: prior echo reviewed.   ASSESSMENT AND PLAN:  1. SCAD/Old MI: Continue beta blocker given h/o SCAD.  Avoid heavy lifting.  2. Fatigue: Improved.  3. Anxiety: Much less since the last visit.    Current medicines are reviewed at length with the patient today.  The patient concerns regarding her medicines were addressed.  The following changes have been made:  No change  Labs/ tests ordered today include:  No orders of the defined types were placed in this encounter.   Recommend 150 minutes/week of aerobic exercise Low fat, low carb, high fiber diet recommended  Disposition:   FU in 1 year   Signed, Larae Grooms, MD  05/29/2019 2:46 PM    [facility] [address], [place], [place]  [address] Phone: [phone]; Fax: [phone]

## 2019-05-29 ENCOUNTER — Encounter: Payer: Self-pay | Admitting: Interventional Cardiology

## 2019-05-29 ENCOUNTER — Ambulatory Visit (INDEPENDENT_AMBULATORY_CARE_PROVIDER_SITE_OTHER): Payer: No Typology Code available for payment source | Admitting: Interventional Cardiology

## 2019-05-29 ENCOUNTER — Other Ambulatory Visit: Payer: Self-pay

## 2019-05-29 VITALS — BP 114/80 | HR 103 | Ht 68.0 in | Wt 145.2 lb

## 2019-05-29 DIAGNOSIS — R5383 Other fatigue: Secondary | ICD-10-CM

## 2019-05-29 DIAGNOSIS — I2542 Coronary artery dissection: Secondary | ICD-10-CM | POA: Diagnosis not present

## 2019-05-29 DIAGNOSIS — I252 Old myocardial infarction: Secondary | ICD-10-CM

## 2019-05-29 NOTE — Patient Instructions (Signed)

## 2019-09-19 ENCOUNTER — Telehealth: Payer: Self-pay

## 2019-09-19 NOTE — Telephone Encounter (Signed)
Paperwork completed by Dr. Irish Lack and given back to Medical Records to return to patient.

## 2019-12-29 ENCOUNTER — Other Ambulatory Visit: Payer: Self-pay | Admitting: *Deleted

## 2019-12-29 ENCOUNTER — Other Ambulatory Visit: Payer: Self-pay

## 2019-12-29 MED ORDER — ASPIRIN 81 MG PO TBEC: 81.0000 mg | DELAYED_RELEASE_TABLET | Freq: Every day | ORAL | 3 refills | Status: DC

## 2019-12-29 MED ORDER — METOPROLOL TARTRATE 25 MG PO TABS: 25.0000 mg | ORAL_TABLET | Freq: Two times a day (BID) | ORAL | 1 refills | Status: DC

## 2020-06-05 NOTE — Progress Notes (Deleted)
Cardiology Office Note   Date:  06/05/2020   ID:  Stephanie Wagner, DOB July 29, 1981, MRN [id number]  PCP:  Laqueta Due., MD    No chief complaint on file.    Wt Readings from Last 3 Encounters:  05/29/19 145 lb 3.2 oz (65.9 kg)  02/01/19 143 lb (64.9 kg)  02/01/19 143 lb (64.9 kg)       History of Present Illness: Stephanie Wagner is a 39 y.o. female  Who had an LAD dissection (SCAD). 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 Normal left ventricular filling pressure. Balloon angioplasty to distal LAD with reestablishment of flow. The apical LAD remains occluded. Possible IV contrast reaction, improved with administration of methylprednisolone 125 mg x 1 and diphenhydramine 50 mg x 1.  Echo before discharge showed: Left Ventricle: Definity contrast agent was given IV to delineate the left ventricular endocardial borders. Limited echo with Definity to assess LV wall motion. There is apical akinesis, EF 50%. No thrombus noted.  Since hospital discharge, she had Occsaional sharp chest pain. She has had fatigue.  She had been anxious with the MI and the corona virus in 2020.    Past Medical History:  Diagnosis Date  . Back pain   . Chronic headaches   . Environmental allergies    animals, dander  . Migraine   . Myocardial infarct (HCC)   . Neck pain   . Seasonal allergies   . Tachycardia     Past Surgical History:  Procedure Laterality Date  . CORONARY/GRAFT ACUTE MI REVASCULARIZATION N/A 12/09/2018   Procedure: CORONARY/GRAFT ACUTE MI REVASCULARIZATION;  Surgeon: Yvonne Kendall, MD;  [facility];  Service: Cardiovascular;  Laterality: N/A;  . DILATION AND CURETTAGE OF UTERUS  08/31/2014   Procedure: Dilatation and Currettage with Ultrasound Guidance;  Surgeon: Sharon Seller, DO;  Location: [facility];  Service: Gynecology;;  . LEFT HEART CATH AND CORONARY ANGIOGRAPHY N/A  12/09/2018   Procedure: LEFT HEART CATH AND CORONARY ANGIOGRAPHY;  Surgeon: Yvonne Kendall, MD;  [facility];  Service: Cardiovascular;  Laterality: N/A;  . WISDOM TOOTH EXTRACTION       Current Outpatient Medications  Medication Sig Dispense Refill  . aspirin 81 MG EC tablet Take 1 tablet (81 mg total) by mouth daily. 90 tablet 3  . Melatonin 1 MG TABS Take 1 mg by mouth at bedtime as needed (sleep).    . metoprolol tartrate (LOPRESSOR) 25 MG tablet Take 1 tablet (25 mg total) by mouth 2 (two) times daily. 180 tablet 1  . Multiple Vitamin (MULTIVITAMIN WITH MINERALS) TABS tablet Take 1 tablet by mouth daily.    . nitroGLYCERIN (NITROSTAT) 0.4 MG SL tablet Place 1 tablet (0.4 mg total) under the tongue every 5 (five) minutes as needed for chest pain. 25 tablet 0   No current facility-administered medications for this visit.    Allergies:   Contrast media [iodinated diagnostic agents], Loratadine, Morphine and related, and Penicillins    Social History:  The patient  reports that she has never smoked. She has never used smokeless tobacco. She reports that she does not drink alcohol and does not use drugs.   Family History:  The patient's ***family history includes Breast cancer in her maternal grandmother; Cancer in her maternal grandfather; Diabetes in her father; Heart disease in her maternal grandfather; Kidney disease in her father; Thyroid disease in her mother.    ROS:  Please see the history of present illness.   Otherwise, review of systems are positive for ***.   All other systems are reviewed and negative.    PHYSICAL EXAM: VS:  There were no vitals taken for this visit. , BMI There is no height or weight on file to calculate BMI. GEN: Well nourished, well developed, in no acute distress  HEENT: normal  Neck: no JVD, carotid bruits, or masses Cardiac: ***RRR; no murmurs, rubs, or gallops,no edema  Respiratory:  clear to auscultation bilaterally, normal  work of breathing GI: soft, nontender, nondistended, + BS MS: no deformity or atrophy  Skin: warm and dry, no rash Neuro:  Strength and sensation are intact Psych: euthymic mood, full affect   EKG:   The ekg ordered today demonstrates ***   Recent Labs: No results found for requested labs within last 8760 hours.   Lipid Panel    Component Value Date/Time   CHOL 127 12/10/2018 0516   TRIG 42 12/10/2018 0516   HDL 51 12/10/2018 0516   CHOLHDL 2.5 12/10/2018 0516   VLDL 8 12/10/2018 0516   LDLCALC 68 12/10/2018 0516     Other studies Reviewed: Additional studies/ records that were reviewed today with results demonstrating: ***.   ASSESSMENT AND PLAN:  1. 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 Fatigue: 3. Anxiety:   Current medicines are reviewed at length with the patient today.  The patient concerns regarding her medicines were addressed.  The following changes have been made:  No change***  Labs/ tests ordered today include: *** No orders of the defined types were placed in this encounter.   Recommend 150 minutes/week of aerobic exercise Low fat, low carb, high fiber diet recommended  Disposition:   FU in ***   Signed, Lance Muss, MD  06/05/2020 12:38 PM    [facility] [address] [place], [place], [place]  [address] Phone: [phone]; Fax: [phone]

## 2020-06-06 ENCOUNTER — Ambulatory Visit: Payer: No Typology Code available for payment source | Admitting: Interventional Cardiology

## 2020-06-24 NOTE — Progress Notes (Signed)
Cardiology Office Note   Date:  06/25/2020   ID:  Stephanie Wagner, DOB 02-21-1981, MRN [id number]  PCP:  Laqueta Due., MD    No chief complaint on file.  SCAD  Wt Readings from Last 3 Encounters:  06/25/20 150 lb (68 kg)  05/29/19 145 lb 3.2 oz (65.9 kg)  02/01/19 143 lb (64.9 kg)       History of Present Illness: Stephanie Wagner is a 39 y.o. female  Who had an LAD dissection (SCAD). 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 Normal left ventricular filling pressure. Balloon angioplasty to distal LAD with reestablishment of flow. The apical LAD remains occluded. Possible IV contrast reaction, improved with administration of methylprednisolone 125 mg x 1 and diphenhydramine 50 mg x 1.  Echo before discharge showed: Left Ventricle: Definity contrast agent was given IV to delineate the left ventricular endocardial borders. Limited echo with Definity to assess LV wall motion. There is apical akinesis, EF 50%. No thrombus noted.  Since hospital discharge, she had Occsaional sharp chest pain. She has had fatigue.  She had been anxious with the MI and the corona virus in 2020.  She had been doing well, exercising.  She increased her cardio workout to once a day.  No lifting.  After going to daily, she had some head pain after exercise.  She saw her PMD and she had an MRI of the brain which was normal per her report.  She has not had her COVID vaccines.      Past Medical History:  Diagnosis Date  . Back pain   . Chronic headaches   . Environmental allergies    animals, dander  . Migraine   . Myocardial infarct (HCC)   . Neck pain   . Seasonal allergies   . Tachycardia     Past Surgical History:  Procedure Laterality Date  . CORONARY/GRAFT ACUTE MI REVASCULARIZATION N/A 12/09/2018   Procedure: CORONARY/GRAFT ACUTE MI REVASCULARIZATION;  Surgeon: Yvonne Kendall, MD;  [facility];  Service: Cardiovascular;  Laterality: N/A;  . DILATION AND CURETTAGE OF UTERUS  08/31/2014   Procedure: Dilatation and Currettage with Ultrasound Guidance;  Surgeon: Sharon Seller, DO;  Location: [facility];  Service: Gynecology;;  . LEFT HEART CATH AND CORONARY ANGIOGRAPHY N/A 12/09/2018   Procedure: LEFT HEART CATH AND CORONARY ANGIOGRAPHY;  Surgeon: Yvonne Kendall, MD;  [facility];  Service: Cardiovascular;  Laterality: N/A;  . WISDOM TOOTH EXTRACTION       Current Outpatient Medications  Medication Sig Dispense Refill  . aspirin 81 MG EC tablet Take 1 tablet (81 mg total) by mouth daily. 90 tablet 3  . metoprolol tartrate (LOPRESSOR) 25 MG tablet Take 1 tablet (25 mg total) by mouth 2 (two) times daily. 180 tablet 1  . Multiple Vitamin (MULTIVITAMIN WITH MINERALS) TABS tablet Take 1 tablet by mouth daily.    . nitroGLYCERIN (NITROSTAT) 0.4 MG SL tablet Place 1 tablet (0.4 mg total) under the tongue every 5 (five) minutes as needed for chest pain. 25 tablet 0   No current facility-administered medications for this visit.    Allergies:   Iodinated diagnostic agents, Loratadine, Morphine and related, and Penicillins    Social History:  The patient  reports that she has never smoked. She has never used smokeless tobacco. She reports that she does not drink alcohol and does not use drugs.   Family History:  The patient's family history includes Breast cancer in her maternal grandmother; Cancer in her maternal grandfather; Diabetes in her father; Heart disease in her maternal grandfather; Kidney disease in her father; Thyroid disease in her mother.    ROS:  Please see the history of present illness.   Otherwise, review of systems are positive for .   All other systems are reviewed and negative.    PHYSICAL EXAM: VS:  BP 120/88   Pulse 73   Ht 5\' 8"  (1.727 m)   Wt 150 lb (68 kg)   BMI 22.81 kg/m  , BMI Body mass index is 22.81 kg/m. GEN: Well nourished, well  developed, in no acute distress  HEENT: normal  Neck: no JVD, carotid bruits, or masses Cardiac: RRR; no murmurs, rubs, or gallops,no edema  Respiratory:  clear to auscultation bilaterally, normal work of breathing GI: soft, nontender, nondistended, + BS MS: no deformity or atrophy  Skin: warm and dry, no rash Neuro:  Strength and sensation are intact Psych: euthymic mood, full affect   EKG:   The ekg ordered today demonstrates NSR, no ST changes   Recent Labs: No results found for requested labs within last 8760 hours.   Lipid Panel    Component Value Date/Time   CHOL 127 12/10/2018 0516   TRIG 42 12/10/2018 0516   HDL 51 12/10/2018 0516   CHOLHDL 2.5 12/10/2018 0516   VLDL 8 12/10/2018 0516   LDLCALC 68 12/10/2018 0516     Other studies Reviewed: Additional studies/ records that were reviewed today with results demonstrating: LDL 68 in 2020. .   ASSESSMENT AND PLAN:  1. 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  COntinue beta-blocker.  Heart rate is well controlled.  Okay to go back to exercise.  She will gradually increase to a target of 5 days a week.  I explained to her that 7 days a week may be harder on her joints.  Hopefully 5 days a week will also decrease the chance of the head pain returning. 2. Fatigue: Resolved after she got back to exercise. 3. Anxiety: Much improved. 4. Still has some anxiety about the Covid vaccine and she has declined this.  We discussed this at length.  I did suggest that if she decided to get the vaccine, she should take either Pfizer or Moderna rather than 2021 given the blood clot issue.   Current medicines are reviewed at length with the patient today.  The patient concerns regarding her medicines were addressed.  The following changes have been made:  No change  Labs/ tests ordered today include:  No orders of the defined types were placed in this encounter.   Recommend 150 minutes/week of aerobic exercise Low fat, low carb, high  fiber diet recommended  Disposition:   FU in 1 year   Signed, [employer], MD  06/25/2020 10:47 AM    [facility] [address] [place], [place], [place]  [place] Phone: [phone]; Fax: [phone]

## 2020-06-25 ENCOUNTER — Encounter: Payer: Self-pay | Admitting: Interventional Cardiology

## 2020-06-25 ENCOUNTER — Ambulatory Visit: Payer: No Typology Code available for payment source | Admitting: Interventional Cardiology

## 2020-06-25 ENCOUNTER — Other Ambulatory Visit: Payer: Self-pay

## 2020-06-25 VITALS — BP 120/88 | HR 73 | Ht 68.0 in | Wt 150.0 lb

## 2020-06-25 DIAGNOSIS — R5383 Other fatigue: Secondary | ICD-10-CM

## 2020-06-25 DIAGNOSIS — F419 Anxiety disorder, unspecified: Secondary | ICD-10-CM | POA: Diagnosis not present

## 2020-06-25 DIAGNOSIS — I252 Old myocardial infarction: Secondary | ICD-10-CM

## 2020-06-25 DIAGNOSIS — R519 Headache, unspecified: Secondary | ICD-10-CM

## 2020-06-25 DIAGNOSIS — I2542 Coronary artery dissection: Secondary | ICD-10-CM

## 2020-06-25 DIAGNOSIS — Z2821 Immunization not carried out because of patient refusal: Secondary | ICD-10-CM

## 2020-06-25 MED ORDER — ASPIRIN 81 MG PO TBEC: 81.0000 mg | DELAYED_RELEASE_TABLET | Freq: Every day | ORAL | 3 refills | Status: DC

## 2020-06-25 MED ORDER — NITROGLYCERIN 0.4 MG SL SUBL: 0.4000 mg | SUBLINGUAL_TABLET | SUBLINGUAL | 3 refills | Status: AC | PRN

## 2020-06-25 MED ORDER — METOPROLOL TARTRATE 25 MG PO TABS: 25.0000 mg | ORAL_TABLET | Freq: Two times a day (BID) | ORAL | 3 refills | Status: DC

## 2020-06-25 NOTE — Patient Instructions (Signed)
Medication Instructions:  Your physician recommends that you continue on your current medications as directed. Please refer to the Current Medication list given to you today.  *If you need a refill on your cardiac medications before your next appointment, please call your pharmacy*   Lab Work: None  If you have labs (blood work) drawn today and your tests are completely normal, you will receive your results only by: Marland Kitchen MyChart Message (if you have MyChart) OR . A paper copy in the mail If you have any lab test that is abnormal or we need to change your treatment, we will call you to review the results.   Testing/Procedures: None   Follow-Up: At [facility], you and your health needs are our priority.  As part of our continuing mission to provide you with exceptional heart care, we have created designated Provider Care Teams.  These Care Teams include your primary Cardiologist (physician) and Advanced Practice Providers (APPs -  Physician Assistants and Nurse Practitioners) who all work together to provide you with the care you need, when you need it.  We recommend signing up for the patient portal called "MyChart".  Sign up information is provided on this After Visit Summary.  MyChart is used to connect with patients for Virtual Visits (Telemedicine).  Patients are able to view lab/test results, encounter notes, upcoming appointments, etc.  Non-urgent messages can be sent to your provider as well.   To learn more about what you can do with MyChart, go to [email or web address].    Your next appointment:   12 month(s)  The format for your next appointment:   In Person  Provider:   You may see Everette Rank, MD or one of the following Advanced Practice Providers on your designated Care Team:    Ronie Spies, PA-C  Jacolyn Reedy, PA-C    Other Instructions Your physician discussed the importance of regular exercise and recommended that you start or continue a regular exercise  program for good health.

## 2020-08-05 ENCOUNTER — Ambulatory Visit: Payer: No Typology Code available for payment source | Admitting: Interventional Cardiology

## 2021-05-22 ENCOUNTER — Ambulatory Visit: Payer: Managed Care, Other (non HMO) | Admitting: Allergy

## 2021-07-01 NOTE — Progress Notes (Signed)
Cardiology Office Note   Date:  07/02/2021   ID:  Stephanie Wagner, DOB 05/16/1981, MRN [id number]  PCP:  Laqueta Due., MD    No chief complaint on file.  SCAD  Wt Readings from Last 3 Encounters:  07/02/21 153 lb 6.4 oz (69.6 kg)  06/25/20 150 lb (68 kg)  05/29/19 145 lb 3.2 oz (65.9 kg)       History of Present Illness: Stephanie Wagner is a 40 y.o. female  Who had an LAD dissection (SCAD).  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 Normal left ventricular filling pressure. Balloon angioplasty to distal LAD with reestablishment of flow.  The apical LAD remains occluded. Possible IV contrast reaction, improved with administration of methylprednisolone 125 mg x 1 and diphenhydramine 50 mg x 1.   Echo before discharge showed: Left Ventricle: Definity contrast agent was given IV to delineate the left ventricular endocardial borders. Limited echo with Definity to assess LV wall motion. There is apical akinesis, EF 50%. No thrombus noted.   Since hospital discharge, she had Occsaional sharp chest pain.  She has had fatigue.     She had been anxious with the MI and the corona virus in 2020.   She had been doing well, exercising.  She increased her cardio workout to once a day.  No lifting.  After going to daily, she had some head pain after exercise.  She saw her PMD and she had an MRI of the brain which was normal per her report.  She has noted a few episodes of palpitations several times a week.  She has occasional chest pains still.    Denies : Dizziness. Leg edema. Nitroglycerin use. Orthopnea. Paroxysmal nocturnal dyspnea. Shortness of breath. Syncope.    Past Medical History:  Diagnosis Date   Back pain    Chronic headaches    Environmental allergies    animals, dander   Migraine    Myocardial infarct (HCC)    Neck pain    Seasonal allergies    Tachycardia     Past Surgical History:  Procedure  Laterality Date   CORONARY/GRAFT ACUTE MI REVASCULARIZATION N/A 12/09/2018   Procedure: CORONARY/GRAFT ACUTE MI REVASCULARIZATION;  Surgeon: Yvonne Kendall, MD;  [facility]: [facility];  Service: Cardiovascular;  Laterality: N/A;   DILATION AND CURETTAGE OF UTERUS  08/31/2014   Procedure: Dilatation and Currettage with Ultrasound Guidance;  Surgeon: Sharon Seller, DO;  Location: [facility];  Service: Gynecology;;   LEFT HEART CATH AND CORONARY ANGIOGRAPHY N/A 12/09/2018   Procedure: LEFT HEART CATH AND CORONARY ANGIOGRAPHY;  Surgeon: Yvonne Kendall, MD;  [facility]: [facility];  Service: Cardiovascular;  Laterality: N/A;   WISDOM TOOTH EXTRACTION       Current Outpatient Medications  Medication Sig Dispense Refill   aspirin 81 MG EC tablet Take 1 tablet (81 mg total) by mouth daily. 90 tablet 3   metoprolol tartrate (LOPRESSOR) 25 MG tablet Take 1 tablet (25 mg total) by mouth 2 (two) times daily. 180 tablet 3   Multiple Vitamin (MULTIVITAMIN WITH MINERALS) TABS tablet Take 1 tablet by mouth daily.     nitroGLYCERIN (NITROSTAT) 0.4 MG SL tablet Place 1 tablet (0.4 mg total) under the tongue every 5 (five) minutes as needed for chest pain. 25 tablet 3   No current facility-administered medications for this visit.    Allergies:   Iodinated diagnostic agents, Loratadine, Morphine and related,  and Penicillins    Social History:  The patient  reports that she has never smoked. She has never used smokeless tobacco. She reports that she does not drink alcohol and does not use drugs.   Family History:  The patient'sfamily history includes Breast cancer in her maternal grandmother; Cancer in her maternal grandfather; Diabetes in her father; Heart disease in her maternal grandfather; Kidney disease in her father; Thyroid disease in her mother.    ROS:  Please see the history of present illness.   Otherwise, review of systems are positive for palpitations.   All other systems are  reviewed and negative.    PHYSICAL EXAM: VS:  BP 110/64   Pulse 76   Ht 5\' 8"  (1.727 m)   Wt 153 lb 6.4 oz (69.6 kg)   SpO2 99%   BMI 23.32 kg/m  , BMI Body mass index is 23.32 kg/m. GEN: Well nourished, well developed, in no acute distress HEENT: normal Neck: no JVD, carotid bruits, or masses Cardiac: RRR; no murmurs, rubs, or gallops,no edema  Respiratory:  clear to auscultation bilaterally, normal work of breathing GI: soft, nontender, nondistended, + BS MS: no deformity or atrophy Skin: warm and dry, no rash Neuro:  Strength and sensation are intact Psych: euthymic mood, full affect   EKG:   The ekg ordered today demonstrates normal sinus rhythm, no ST segment changes   Recent Labs: No results found for requested labs within last 8760 hours.   Lipid Panel    Component Value Date/Time   CHOL 127 12/10/2018 0516   TRIG 42 12/10/2018 0516   HDL 51 12/10/2018 0516   CHOLHDL 2.5 12/10/2018 0516   VLDL 8 12/10/2018 0516   LDLCALC 68 12/10/2018 0516     Other studies Reviewed: Additional studies/ records that were reviewed today with results demonstrating: Prior labs reviewed from 2020.  She is getting labs within the next few weeks with her primary care doctor..   ASSESSMENT AND PLAN:  SCAD: No anginal sx. she continues to have some random atypical chest pains.  They do not feel like what her prior MI felt like.  She tries to just reassure herself that they are short-lived.  She is exercising without any difficulties. Old MI: No CHF sx. appears euvolemic. Palpitations:  3-4x/week, she has sudden increases in her HR.  THis provokes some Anxiety.  Will plan for 2 weeks Zio patch.  Unlikely that she would have atrial fibrillation but she does report a significant and sudden fluctuation in heart rate where the heart rate may increase by 30 beats within a few seconds.    Current medicines are reviewed at length with the patient today.  The patient concerns regarding her  medicines were addressed.  The following changes have been made:  No change  Labs/ tests ordered today include: Zio patch No orders of the defined types were placed in this encounter.   Recommend 150 minutes/week of aerobic exercise Low fat, low carb, high fiber diet recommended  Disposition:   FU in 1 year   Signed, 2021, MD  07/02/2021 4:44 PM    [facility] [address] [place], [place], [place]  [place] Phone: [phone]; Fax: [phone]

## 2021-07-02 ENCOUNTER — Ambulatory Visit: Payer: No Typology Code available for payment source | Admitting: Interventional Cardiology

## 2021-07-02 ENCOUNTER — Encounter: Payer: Self-pay | Admitting: Interventional Cardiology

## 2021-07-02 ENCOUNTER — Other Ambulatory Visit: Payer: Self-pay

## 2021-07-02 ENCOUNTER — Ambulatory Visit (INDEPENDENT_AMBULATORY_CARE_PROVIDER_SITE_OTHER): Payer: No Typology Code available for payment source

## 2021-07-02 VITALS — BP 110/64 | HR 76 | Ht 68.0 in | Wt 153.4 lb

## 2021-07-02 DIAGNOSIS — R002 Palpitations: Secondary | ICD-10-CM

## 2021-07-02 DIAGNOSIS — I252 Old myocardial infarction: Secondary | ICD-10-CM | POA: Diagnosis not present

## 2021-07-02 DIAGNOSIS — I2542 Coronary artery dissection: Secondary | ICD-10-CM

## 2021-07-02 DIAGNOSIS — F419 Anxiety disorder, unspecified: Secondary | ICD-10-CM | POA: Diagnosis not present

## 2021-07-02 MED ORDER — METOPROLOL TARTRATE 25 MG PO TABS: 25.0000 mg | ORAL_TABLET | Freq: Two times a day (BID) | ORAL | 3 refills | Status: DC

## 2021-07-02 NOTE — Patient Instructions (Signed)
Medication Instructions:  Your physician recommends that you continue on your current medications as directed. Please refer to the Current Medication list given to you today.  *If you need a refill on your cardiac medications before your next appointment, please call your pharmacy*   Lab Work: none If you have labs (blood work) drawn today and your tests are completely normal, you will receive your results only by: MyChart Message (if you have MyChart) OR A paper copy in the mail If you have any lab test that is abnormal or we need to change your treatment, we will call you to review the results.   Testing/Procedures: Your physician has recommended that you wear a holter monitor. Holter monitors are medical devices that record the heart's electrical activity. Doctors most often use these monitors to diagnose arrhythmias. Arrhythmias are problems with the speed or rhythm of the heartbeat. The monitor is a small, portable device. You can wear one while you do your normal daily activities. This is usually used to diagnose what is causing palpitations/syncope (passing out). 2 week zio   Follow-Up: At [facility], you and your health needs are our priority.  As part of our continuing mission to provide you with exceptional heart care, we have created designated Provider Care Teams.  These Care Teams include your primary Cardiologist (physician) and Advanced Practice Providers (APPs -  Physician Assistants and Nurse Practitioners) who all work together to provide you with the care you need, when you need it.  We recommend signing up for the patient portal called "MyChart".  Sign up information is provided on this After Visit Summary.  MyChart is used to connect with patients for Virtual Visits (Telemedicine).  Patients are able to view lab/test results, encounter notes, upcoming appointments, etc.  Non-urgent messages can be sent to your provider as well.   To learn more about what you can do  with MyChart, go to [email or web address].    Your next appointment:   12 month(s)--sooner if you have any problems  The format for your next appointment:   In Person  Provider:   You may see Dr Eldridge Dace or one of the following Advanced Practice Providers on your designated Care Team:   Ronie Spies, PA-C Jacolyn Reedy, PA-C   Other Instructions  Stephanie Wagner- Long Term Monitor Instructions  Your physician has requested you wear a ZIO patch monitor for 14 days.  This is a single patch monitor. [employer] supplies one patch monitor per enrollment. Additional stickers are not available. Please do not apply patch if you will be having a Nuclear Stress Test,  Echocardiogram, Cardiac CT, MRI, or Chest Xray during the period you would be wearing the  monitor. The patch cannot be worn during these tests. You cannot remove and re-apply the  ZIO XT patch monitor.  Your ZIO patch monitor will be mailed 3 day USPS to your address on file. It may take 3-5 days  to receive your monitor after you have been enrolled.  Once you have received your monitor, please review the enclosed instructions. Your monitor  has already been registered assigning a specific monitor serial # to you.  Billing and Patient Assistance Program Information  We have supplied [employer] with any of your insurance information on file for billing purposes. [employer] offers a sliding scale Patient Assistance Program for patients that do not have  insurance, or whose insurance does not completely cover the cost of the ZIO monitor.  You must apply for the Patient Assistance Program  to qualify for this discounted rate.  To apply, please call [employer] at [phone], select option 4, select option 2, ask to apply for  Patient Assistance Program. Stephanie Wagner will ask your household income, and how many people  are in your household. They will quote your out-of-pocket cost based on that information.  [employer] will also be able to set up a  92-month, interest-free payment plan if needed.  Applying the monitor   Shave hair from upper left chest.  Hold abrader disc by orange tab. Rub abrader in 40 strokes over the upper left chest as  indicated in your monitor instructions.  Clean area with 4 enclosed alcohol pads. Let dry.  Apply patch as indicated in monitor instructions. Patch will be placed under collarbone on left  side of chest with arrow pointing upward.  Rub patch adhesive wings for 2 minutes. Remove white label marked "1". Remove the white  label marked "2". Rub patch adhesive wings for 2 additional minutes.  While looking in a mirror, press and release button in center of patch. A small green light will  flash 3-4 times. This will be your only indicator that the monitor has been turned on.  Do not shower for the first 24 hours. You may shower after the first 24 hours.  Press the button if you feel a symptom. You will hear a small click. Record Date, Time and  Symptom in the Patient Logbook.  When you are ready to remove the patch, follow instructions on the last 2 pages of Patient  Logbook. Stick patch monitor onto the last page of Patient Logbook.  Place Patient Logbook in the blue and white box. Use locking tab on box and tape box closed  securely. The blue and white box has prepaid postage on it. Please place it in the mailbox as  soon as possible. Your physician should have your test results approximately 7 days after the  monitor has been mailed back to [facility].  Call [facility] Customer Care at [phone] if you have questions regarding  your ZIO XT patch monitor. Call them immediately if you see an orange light blinking on your  monitor.  If your monitor falls off in less than 4 days, contact our Monitor department at [phone].  If your monitor becomes loose or falls off after 4 days call [employer] at [phone] for  suggestions on securing your monitor

## 2021-07-02 NOTE — Progress Notes (Unsigned)
Patient enrolled for [employer] to mail a 14 day ZIO XT monitor to her address on file. 

## 2021-07-02 NOTE — Addendum Note (Signed)
Addended by: Dossie Arbour on: 07/02/2021 05:06 PM   Modules accepted: Orders

## 2021-07-06 DIAGNOSIS — R002 Palpitations: Secondary | ICD-10-CM | POA: Diagnosis not present

## 2021-07-08 ENCOUNTER — Telehealth: Payer: Self-pay | Admitting: *Deleted

## 2021-07-08 NOTE — Telephone Encounter (Signed)
Patient removed her 14 day ZIO XT monitor early due to the patch causing burning sensation.  Instructed patient to mail monitor back to [facility] so they may process whatever data was recorded, (approx. 6 days).

## 2021-07-11 ENCOUNTER — Telehealth: Payer: Self-pay | Admitting: Interventional Cardiology

## 2021-07-11 NOTE — Telephone Encounter (Signed)
Depends how severe her symptoms are.  If they are infrequent, then monitor is less likely to pick up something.  No high risk features to her symptoms but if she is really bothered by the palpitations, then can do monitor.    A smart watch that can do ECG, like Apple Watch,  would also be an option if she has that device.

## 2021-07-11 NOTE — Telephone Encounter (Signed)
I spoke with patient. She wore monitor recently but had to return early due to skin burning. States she wore for 2 days and did not have any heart rate issues while wearing monitor. Last night while at dance class heart rate jumped up to 157.  Class was very basic and she was not exerting herself a great deal.  Heart rate eventually came down but she continued to have periods last night while trying to get to sleep where heart rate would jump to upper 90's. Describes it as feeling like a jolt when her heart rate jumps up.  She reports it feels like she always has a heaviness in her chest and is aware of her heart beat.  Not like MI pain. Same as symptoms she discussed with Dr Eldridge Dace at last office visit. I checked with monitor department and the only other monitor option is event monitor with sensitive skin patches.  Patient would be charged for this monitor in addition to first monitor. Patient willing to try monitor with sensitive skin patches if Dr Eldridge Dace feels she should

## 2021-07-11 NOTE — Telephone Encounter (Signed)
Patient said she was laying in bed last night trying to fall asleep and her heart rate would jump all of a sudden from a resting HR to a faster HR. Patient said she had her first ballroom dance class last night .It spiked during her class and she had to stop dancing and calm herself down. She said this is the reason why she had to wear the heart monitor, but she had to take it off because she had an allergic reaction.  The patient wanted to know if there was another kind of heart monitor that she can wear.  STAT if HR is under 50 or over 120 (normal HR is 60-100 beats per minute)  What is your heart rate? 87 (at time of call)   Do you have a log of your heart rate readings (document readings)?   Patient said she has a FitBit and watches it, and the fastest it got last night was 157  Do you have any other symptoms? A little sick to her stomach when it jumps up so fast.

## 2021-07-11 NOTE — Telephone Encounter (Signed)
Patient notified.  She has fitbit and it seems it is able to record EKG readings.  She will send readings through my chart.  Will hold off wearing additional monitor at this time until readings from fit bit evaluated.

## 2021-07-16 ENCOUNTER — Telehealth: Payer: Self-pay | Admitting: Interventional Cardiology

## 2021-07-16 NOTE — Telephone Encounter (Signed)
I spoke with patient and reviewed monitor results with her.  She reports feeling very anxious last night after seeing results on my chart and her heart rate became elevated.  She was unable to record with her fitbit.  She only wore monitor for one day as it burned her skin.  She does not want to wear 30 day monitor at this time and will try to capture episodes on her fitbit.  She will let us know if she wants to wear monitor.

## 2021-07-16 NOTE — Telephone Encounter (Signed)
Follow Up:    Patient says she have some questions about her test results that was on My Chart yesterday.

## 2021-07-16 NOTE — Telephone Encounter (Signed)
Left message to call back.  Comments sent through my chart

## 2021-07-16 NOTE — Telephone Encounter (Signed)
Patient is returning call.  °

## 2021-07-17 NOTE — Telephone Encounter (Signed)
Strip shows borderline sinus tach, HR 100.  No AFib.  THis is reassuring.  Average HR was good on monitor so no other therapy needed.  Would just try relaxationand deep breathing when she gets this feeling to get her HR down.  I don't want to add any meds for this to not take risk of getting side effects.   JV

## 2021-07-31 ENCOUNTER — Ambulatory Visit: Payer: Managed Care, Other (non HMO) | Admitting: Allergy

## 2021-11-22 ENCOUNTER — Encounter: Payer: Self-pay | Admitting: Interventional Cardiology

## 2022-07-12 ENCOUNTER — Other Ambulatory Visit: Payer: Self-pay | Admitting: Interventional Cardiology

## 2022-08-16 ENCOUNTER — Other Ambulatory Visit: Payer: Self-pay | Admitting: Interventional Cardiology

## 2022-08-17 ENCOUNTER — Encounter: Payer: Self-pay | Admitting: Cardiovascular Disease

## 2022-08-17 ENCOUNTER — Telehealth: Payer: Self-pay | Admitting: Interventional Cardiology

## 2022-08-17 MED ORDER — METOPROLOL TARTRATE 25 MG PO TABS: 25.0000 mg | ORAL_TABLET | Freq: Two times a day (BID) | ORAL | 0 refills | Status: DC

## 2022-08-17 NOTE — Telephone Encounter (Signed)
*  STAT* If patient is at the pharmacy, call can be transferred to refill team.   1. Which medications need to be refilled? (please list name of each medication and dose if known) metoprolol tartrate (LOPRESSOR) 25 MG tablet  2. Which pharmacy/location (including street and city if local pharmacy) is medication to be sent to? [id number] - [place], [place] - [address]  3. Do they need a 30 day or 90 day supply? 15  Pt made an appt for 08/25/22

## 2022-08-17 NOTE — Telephone Encounter (Signed)
Error

## 2022-08-17 NOTE — Telephone Encounter (Signed)
Pt's medication was sent to pt's pharmacy as requested. Confirmation received.  °

## 2022-08-24 NOTE — Progress Notes (Unsigned)
Cardiology Office Note   Date:  08/25/2022   ID:  Stephanie Wagner, DOB 12-18-1980, MRN [id number]  PCP:  Laqueta Due., MD    No chief complaint on file.  SCAD  Wt Readings from Last 3 Encounters:  08/25/22 153 lb (69.4 kg)  07/02/21 153 lb 6.4 oz (69.6 kg)  06/25/20 150 lb (68 kg)       History of Present Illness: Stephanie Wagner is a 41 y.o. female  Who had an LAD dissection (SCAD).  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 Normal left ventricular filling pressure. Balloon angioplasty to distal LAD with reestablishment of flow.  The apical LAD remains occluded. Possible IV contrast reaction, improved with administration of methylprednisolone 125 mg x 1 and diphenhydramine 50 mg x 1.   Echo before discharge showed: Left Ventricle: Definity contrast agent was given IV to delineate the left ventricular endocardial borders. Limited echo with Definity to assess LV wall motion. There is apical akinesis, EF 50%. No thrombus noted.   Since hospital discharge, she had Occsaional sharp chest pain.  She has had fatigue.     She had been anxious with the MI and the corona virus in 2020.   She had been doing well, exercising.  She increased her cardio workout to once a day.  No lifting.  After going to daily, she had some head pain after exercise.  She saw her PMD and she had an MRI of the brain which was normal per her report.  She has had occasional palpitations in the past.  In 2022, she reported: " a significant and sudden fluctuation in heart rate where the heart rate may increase by 30 beats within a few seconds. "  2022 Monitor showed: "Normal sinus rhythm, sinus tachycardia.  Rare PACs and PVCs.  Average HR 77 bpm. No pathologic arrhythmias.   Palpitations are likely sinus tach.  No other treatment needed at this time since average HR is normal."  As of 2023, She is working in retail, part time.   Getting 12K steps /day.   Denies : Chest pain. Dizziness. Leg edema. Nitroglycerin use. Orthopnea.  Paroxysmal nocturnal dyspnea. Shortness of breath. Syncope.    Still has the episodes of high HR, particularly at night.  Episodes can last 2 minutes.  They do provoke some anxiety.  No change from 2022 at the tim of the monitor.  No trigger.  Past Medical History:  Diagnosis Date   Back pain    Chronic headaches    Environmental allergies    animals, dander   Migraine    Myocardial infarct (HCC)    Neck pain    Seasonal allergies    Tachycardia     Past Surgical History:  Procedure Laterality Date   CORONARY/GRAFT ACUTE MI REVASCULARIZATION N/A 12/09/2018   Procedure: CORONARY/GRAFT ACUTE MI REVASCULARIZATION;  Surgeon: Yvonne Kendall, MD;  Location: [facility];  Service: Cardiovascular;  Laterality: N/A;   DILATION AND CURETTAGE OF UTERUS  08/31/2014   Procedure: Dilatation and Currettage with Ultrasound Guidance;  Surgeon: Sharon Seller, DO;  Location: [facility];  Service: Gynecology;;   LEFT HEART CATH AND CORONARY ANGIOGRAPHY N/A 12/09/2018   Procedure: LEFT HEART CATH AND CORONARY ANGIOGRAPHY;  Surgeon: Yvonne Kendall, MD;  Location: [facility];  Service: Cardiovascular;  Laterality: N/A;   WISDOM TOOTH EXTRACTION       Current Outpatient Medications  Medication Sig  Dispense Refill   aspirin 81 MG EC tablet Take 1 tablet (81 mg total) by mouth daily. 90 tablet 3   metoprolol tartrate (LOPRESSOR) 25 MG tablet Take 1 tablet (25 mg total) by mouth 2 (two) times daily. 60 tablet 0   Multiple Vitamin (MULTIVITAMIN WITH MINERALS) TABS tablet Take 1 tablet by mouth daily.     nitroGLYCERIN (NITROSTAT) 0.4 MG SL tablet Place 1 tablet (0.4 mg total) under the tongue every 5 (five) minutes as needed for chest pain. 25 tablet 3   No current facility-administered medications for this visit.    Allergies:   Iodinated contrast media, Loratadine, Morphine and related,  and Penicillins    Social History:  The patient  reports that she has never smoked. She has never used smokeless tobacco. She reports that she does not drink alcohol and does not use drugs.   Family History:  The patient's family history includes Breast cancer in her maternal grandmother; Cancer in her maternal grandfather; Diabetes in her father; Heart disease in her maternal grandfather; Kidney disease in her father; Thyroid disease in her mother.    ROS:  Please see the history of present illness.   Otherwise, review of systems are positive for palpitations.   All other systems are reviewed and negative.    PHYSICAL EXAM: VS:  BP 116/80   Pulse 69   Ht 5\' 8"  (1.727 m)   Wt 153 lb (69.4 kg)   SpO2 99%   BMI 23.26 kg/m  , BMI Body mass index is 23.26 kg/m. GEN: Well nourished, well developed, in no acute distress HEENT: normal Neck: no JVD, carotid bruits, or masses Cardiac: RRR; no murmurs, rubs, or gallops,no edema  Respiratory:  clear to auscultation bilaterally, normal work of breathing GI: soft, nontender, nondistended, + BS MS: no deformity or atrophy Skin: warm and dry, no rash Neuro:  Strength and sensation are intact Psych: euthymic mood, full affect   EKG:   The ekg ordered today demonstrates NSR, no ST changes   Recent Labs: No results found for requested labs within last 365 days.   Lipid Panel    Component Value Date/Time   CHOL 127 12/10/2018 0516   TRIG 42 12/10/2018 0516   HDL 51 12/10/2018 0516   CHOLHDL 2.5 12/10/2018 0516   VLDL 8 12/10/2018 0516   LDLCALC 68 12/10/2018 0516     Other studies Reviewed: Additional studies/ records that were reviewed today with results demonstrating: will try to get her labs from Dr. Cletis Media.   ASSESSMENT AND PLAN:  SCAD: Prior episode several years ago.  No recent angina like her prior event.  Continue healthy lifestyle.  Avoid heavy straining.   Old MI: No CHF symptoms.  Mentally, things are getting easier  as the years past from her SCAD.  She still thinks more about her heart than she used to, particularly when she feels any type of twinge. Palpitations: Symptoms well controlled.  Symptoms have correlated to sinus tachycardia.  She does have PACs at times.  Managed with breathing techniques.  We discussed increasing metoprolol but we agree that it is reasonable to try to manage these without additional medication.   Current medicines are reviewed at length with the patient today.  The patient concerns regarding her medicines were addressed.  The following changes have been made:  No change  Labs/ tests ordered today include:  No orders of the defined types were placed in this encounter.   Recommend 150 minutes/week of aerobic  exercise Low fat, low carb, high fiber diet recommended  Disposition:   FU in 1 year   Signed, Lance Muss, MD  08/25/2022 9:21 AM    [facility] [address] [place], [place], [place]  [address] Phone: [phone]; Fax: [phone]

## 2022-08-25 ENCOUNTER — Ambulatory Visit
Payer: No Typology Code available for payment source | Attending: Interventional Cardiology | Admitting: Interventional Cardiology

## 2022-08-25 ENCOUNTER — Encounter: Payer: Self-pay | Admitting: Interventional Cardiology

## 2022-08-25 VITALS — BP 116/80 | HR 69 | Ht 68.0 in | Wt 153.0 lb

## 2022-08-25 DIAGNOSIS — I2542 Coronary artery dissection: Secondary | ICD-10-CM | POA: Diagnosis not present

## 2022-08-25 DIAGNOSIS — I252 Old myocardial infarction: Secondary | ICD-10-CM | POA: Diagnosis not present

## 2022-08-25 DIAGNOSIS — R002 Palpitations: Secondary | ICD-10-CM | POA: Diagnosis not present

## 2022-08-25 MED ORDER — METOPROLOL TARTRATE 25 MG PO TABS: 25.0000 mg | ORAL_TABLET | Freq: Two times a day (BID) | ORAL | 3 refills | Status: DC

## 2022-08-25 NOTE — Patient Instructions (Signed)
Medication Instructions:  Your physician recommends that you continue on your current medications as directed. Please refer to the Current Medication list given to you today.  *If you need a refill on your cardiac medications before your next appointment, please call your pharmacy*   Lab Work: none If you have labs (blood work) drawn today and your tests are completely normal, you will receive your results only by: MyChart Message (if you have MyChart) OR A paper copy in the mail If you have any lab test that is abnormal or we need to change your treatment, we will call you to review the results.   Testing/Procedures: none   Follow-Up: At [facility], you and your health needs are our priority.  As part of our continuing mission to provide you with exceptional heart care, we have created designated Provider Care Teams.  These Care Teams include your primary Cardiologist (physician) and Advanced Practice Providers (APPs -  Physician Assistants and Nurse Practitioners) who all work together to provide you with the care you need, when you need it.  We recommend signing up for the patient portal called "MyChart".  Sign up information is provided on this After Visit Summary.  MyChart is used to connect with patients for Virtual Visits (Telemedicine).  Patients are able to view lab/test results, encounter notes, upcoming appointments, etc.  Non-urgent messages can be sent to your provider as well.   To learn more about what you can do with MyChart, go to [email or web address].    Your next appointment:   12 month(s)  The format for your next appointment:   In Person  Provider:   Jayadeep Varanasi, MD     Other Instructions    Important Information About Sugar       

## 2023-04-21 DIAGNOSIS — I781 Nevus, non-neoplastic: Secondary | ICD-10-CM | POA: Diagnosis not present

## 2023-04-21 DIAGNOSIS — L821 Other seborrheic keratosis: Secondary | ICD-10-CM | POA: Diagnosis not present

## 2023-05-10 ENCOUNTER — Other Ambulatory Visit: Payer: Self-pay

## 2023-05-10 ENCOUNTER — Ambulatory Visit (INDEPENDENT_AMBULATORY_CARE_PROVIDER_SITE_OTHER): Payer: BLUE CROSS/BLUE SHIELD | Admitting: Internal Medicine

## 2023-05-10 ENCOUNTER — Encounter: Payer: Self-pay | Admitting: Internal Medicine

## 2023-05-10 VITALS — BP 120/80 | HR 80 | Temp 98.3°F | Ht 68.5 in | Wt 154.3 lb

## 2023-05-10 DIAGNOSIS — H1013 Acute atopic conjunctivitis, bilateral: Secondary | ICD-10-CM | POA: Diagnosis not present

## 2023-05-10 DIAGNOSIS — Z880 Allergy status to penicillin: Secondary | ICD-10-CM

## 2023-05-10 DIAGNOSIS — T781XXA Other adverse food reactions, not elsewhere classified, initial encounter: Secondary | ICD-10-CM | POA: Diagnosis not present

## 2023-05-10 DIAGNOSIS — J310 Chronic rhinitis: Secondary | ICD-10-CM | POA: Diagnosis not present

## 2023-05-10 DIAGNOSIS — T360X5A Adverse effect of penicillins, initial encounter: Secondary | ICD-10-CM

## 2023-05-10 MED ORDER — EPINEPHRINE 0.3 MG/0.3ML IJ SOAJ: 0.3000 mg | INTRAMUSCULAR | 2 refills | Status: DC | PRN

## 2023-05-10 NOTE — Progress Notes (Addendum)
NEW PATIENT Date of Service/Encounter:  05/10/23 Referring provider: Laqueta Due., MD Primary care provider: Laqueta Due., MD  Subjective:  Stephanie Wagner is a 42 y.o. female with PMHx of migraines, hx of MI, polycystic kidney disease chronic rhinitis presenting today for evaluation of concern for food allergy and chronic rhinitis. History obtained from: chart review and patient.   Concern for food allergy: She reports that she lost taste after having covid the first time. When her taste returned, she no longer liked peanut butter. Had been avoiding.  Here recently she started to desire eating peanut butter again, but the roof of her mouth - it feels "torn up" and raw when she it eats it.  It burns a little. Has happened around 4-5 times. She does need to take progesterone made with peanut oil and is now concerned that it is not safe. She eats and tolerates tree nuts.  Chronic rhinitis:  Does have a history of environmental allergies.  Pet dander, dust and pollens were positive on testing which she last had in middle school. Perennial symptoms of congestion, sneezing, watery/itchy eyes, barking cough when her symptoms are at their worst.  Her throat will get scratchy. Meds tried: all OTC AH (zyrtec, claritin, allegra, xyzal-none seemed to help), flonase worked temporarily for one year. Last spring she did try nasonex and it worked a little bit.  She has never done allergy injections.  Other allergy screening: Asthma: no Medication allergy:  penicillin-violent emesis in childhood, none since. Hymenoptera allergy: no Eczema:no History of recurrent infections suggestive of immunodeficency: no Vaccinations are up to date.   Past Medical History: Past Medical History:  Diagnosis Date   Back pain    Chronic headaches    Environmental allergies    animals, dander   Migraine    Myocardial infarct (HCC)    Neck pain    Seasonal allergies    Tachycardia    Urticaria     Medication List:  Current Outpatient Medications  Medication Sig Dispense Refill   EPINEPHrine (EPIPEN 2-PAK) 0.3 mg/0.3 mL IJ SOAJ injection Inject 0.3 mg into the muscle as needed for anaphylaxis. 2 each 2   metoprolol tartrate (LOPRESSOR) 25 MG tablet Take 1 tablet (25 mg total) by mouth 2 (two) times daily. 180 tablet 3   Multiple Vitamin (MULTIVITAMIN WITH MINERALS) TABS tablet Take 1 tablet by mouth daily.     aspirin 81 MG EC tablet Take 1 tablet (81 mg total) by mouth daily. (Patient not taking: Reported on 05/10/2023) 90 tablet 3   nitroGLYCERIN (NITROSTAT) 0.4 MG SL tablet Place 1 tablet (0.4 mg total) under the tongue every 5 (five) minutes as needed for chest pain. (Patient not taking: Reported on 05/10/2023) 25 tablet 3   No current facility-administered medications for this visit.   Known Allergies:  Allergies  Allergen Reactions   Iodinated Contrast Media Shortness Of Breath    Dyspnea and rash during heart catheterization Dyspnea and rash during heart catheterization   Loratadine Palpitations and Other (See Comments)    Just during pregnancy   Morphine And Codeine Nausea And Vomiting   Penicillins Nausea And Vomiting and Rash    Did it involve swelling of the face/tongue/throat, SOB, or low BP? No Did it involve sudden or severe rash/hives, skin peeling, or any reaction on the inside of your mouth or nose? Yes Did you need to seek medical attention at a hospital or doctor's office? No When did it last happen?  childhood If all above answers are "NO", may proceed with cephalosporin use.    Past Surgical History: Past Surgical History:  Procedure Laterality Date   CORONARY/GRAFT ACUTE MI REVASCULARIZATION N/A 12/09/2018   Procedure: CORONARY/GRAFT ACUTE MI REVASCULARIZATION;  Surgeon: Yvonne Kendall, MD;  Location: [facility];  Service: Cardiovascular;  Laterality: N/A;   DILATION AND CURETTAGE OF UTERUS  08/31/2014   Procedure: Dilatation and Currettage  with Ultrasound Guidance;  Surgeon: Sharon Seller, DO;  Location: [facility];  Service: Gynecology;;   LEFT HEART CATH AND CORONARY ANGIOGRAPHY N/A 12/09/2018   Procedure: LEFT HEART CATH AND CORONARY ANGIOGRAPHY;  Surgeon: Yvonne Kendall, MD;  Location: [facility];  Service: Cardiovascular;  Laterality: N/A;   WISDOM TOOTH EXTRACTION     Family History: Family History  Problem Relation Age of Onset   Thyroid disease Mother    Allergic rhinitis Father    Diabetes Father    Kidney disease Father        transplant   Breast cancer Maternal Grandmother    Heart disease Maternal Grandfather    Cancer Maternal Grandfather        testicular   Social History: Stephanie Wagner lives in a house built 6 years ago, no water damage, carpet in the bedroom, pet cat, no roaches, using DM protection on bedding but not pillow, no smoke exposure, works as IT sales professional at [employer], exposed to dust, no HEPA filter in the home, [place].   ROS:  All other systems negative except as noted per HPI.  Objective:  Blood pressure 120/80, pulse 80, temperature 98.3 F (36.8 C), height 5' 8.5" (1.74 m), weight 154 lb 4.8 oz (70 kg), SpO2 98%, unknown if currently breastfeeding. Body mass index is 23.12 kg/m. Physical Exam:  General Appearance:  Alert, cooperative, no distress, appears stated age  Head:  Normocephalic, without obvious abnormality, atraumatic  Eyes:  Conjunctiva clear, EOM's intact  Ears EACs normal bilaterally and normal TMs bilaterally  Nose: Nares normal, hypertrophic turbinates and normal mucosa  Throat: Lips, tongue normal; teeth and gums normal, normal posterior oropharynx  Neck: Supple, symmetrical  Lungs:   clear to auscultation bilaterally, Respirations unlabored, no coughing  Heart:  regular rate and rhythm and no murmur, Appears well perfused  Extremities: No edema  Skin: Skin color, texture, turgor normal and no rashes or lesions on visualized  portions of skin  Neurologic: No gross deficits   Diagnostics: Skin Testing: Select foods. Adequate positive and negative controls. Results discussed with patient/family.  Food Adult Perc - 05/10/23 0900     Time Antigen Placed 2956    Allergen Manufacturer Waynette Buttery    Location Arm    Number of allergen test 1     Control-buffer 50% Glycerol Negative    Control-Histamine 3+    1. Peanut Negative             Allergy testing results were read and interpreted by myself, documented by clinical staff.  Labs:  Lab Orders         IgE Peanut w/Component Reflex       Assessment and Plan  Food allergy vs intolerance:  - today's skin testing was negative - labs today for confirmation - please strictly avoid peanuts - okay to continue eating tree nuts - for SKIN only reaction, okay to take Benadryl 2 capsules every 4 hours - for SKIN + ANY additional symptoms, OR IF concern for LIFE THREATENING reaction = Epipen Autoinjector  EpiPen 0.3 mg. (Unclear that this will be needed, but until we have a clear picture of your true risk, would carry to be safe as peanut is a high risk food). - If using Epinephrine autoinjector, call 911 - A food allergy action plan has been provided and discussed. - Medic Alert identification is recommended. Peanut oil is safe as long as it is not cold pressed peanut oil.  Refined peanut oil is okay to eat and/or consume. I suspect your progesterone will have refined peanut oil.  Chronic Rhinitis: - allergy testing code 40981 x the number of units to be tested - we may also recommend intradermal testing depending on these results: allergy code 19147 x the number of units (maximum 16) - Prevention: pending results of allergy testing - allergen avoidance when possible - consider allergy shots as long term control of your symptoms by teaching your immune system to be more tolerant of your allergy triggers - Symptom control: - Start Ryaltris 1-2 sprays in each  nostril twice a day as needed for nasal congestion/itchy nose - Consider Antihistamine: daily or daily as needed.  May take 2 per day for breakthrough symptoms. -Options include Zyrtec (Cetirizine) 10mg , Claritin (Loratadine) 10mg , Allegra (Fexofenadine) 180mg , or Xyzal (Levocetirinze) 5mg  - Can be purchased over-the-counter if not covered by insurance.  Allergic Conjunctivitis:  - Consider Allergy Eye drops-great options include Pataday (Olopatadine) or Zaditor (ketotifen) for eye symptoms daily as needed-both sold over the counter if not covered by insurance.  -Avoid eye drops that say red eye relief as they may contain medications that dry out your eyes.  Adverse reaction to penicillin:  - please schedule follow-up appt at your convenience for penicillin testing followed by graded oral challenge if indicated - please refrain from taking any antihistamines at least 3 days prior to this appointment  - around 80% of individuals outgrow this allergy in ~ 10 years and carrying it as a diagnosis can prevent you from getting proper therapy if needed  Follow up : at your convenience for environmental allergy testing or penicillin testing (2 separate appointments). It was a pleasure meeting you in clinic today! Thank you for allowing me to participate in your care.  This note in its entirety was forwarded to the Provider who requested this consultation.  Other:  none  Thank you for your kind referral. I appreciate the opportunity to take part in Jodine's care. Please do not hesitate to contact me with questions.  Sincerely,  Tonny Bollman, MD [facility]

## 2023-05-10 NOTE — Patient Instructions (Signed)
Food allergy vs intolerance:  - today's skin testing was negative - labs today for confirmation - please strictly avoid peanuts - okay to continue eating tree nuts - for SKIN only reaction, okay to take Benadryl 2 capsules every 4 hours - for SKIN + ANY additional symptoms, OR IF concern for LIFE THREATENING reaction = Epipen Autoinjector EpiPen 0.3 mg. (Unclear that this will be needed, but until we have a clear picture of your true risk, would carry to be safe as peanut is a high risk food). - If using Epinephrine autoinjector, call 911 - A food allergy action plan has been provided and discussed. - Medic Alert identification is recommended. Peanut oil is safe as long as it is not cold pressed peanut oil.  Refined peanut oil is okay to eat and/or consume. I suspect your progesterone will have refined peanut oil.  Chronic Rhinitis: - allergy testing code 27253 x the number of units to be tested - we may also recommend intradermal testing depending on these results: allergy code 66440 x the number of units (maximum 16) - Prevention: pending results of allergy testing - allergen avoidance when possible - consider allergy shots as long term control of your symptoms by teaching your immune system to be more tolerant of your allergy triggers - Symptom control: - Start Ryaltris 1-2 sprays in each nostril twice a day as needed for nasal congestion/itchy nose - Consider Antihistamine: daily or daily as needed.  May take 2 per day for breakthrough symptoms. -Options include Zyrtec (Cetirizine) 10mg , Claritin (Loratadine) 10mg , Allegra (Fexofenadine) 180mg , or Xyzal (Levocetirinze) 5mg  - Can be purchased over-the-counter if not covered by insurance.  Allergic Conjunctivitis:  - Consider Allergy Eye drops-great options include Pataday (Olopatadine) or Zaditor (ketotifen) for eye symptoms daily as needed-both sold over the counter if not covered by insurance.  -Avoid eye drops that say red eye relief  as they may contain medications that dry out your eyes.  Adverse reaction to penicillin:  - please schedule follow-up appt at your convenience for penicillin testing followed by graded oral challenge if indicated - please refrain from taking any antihistamines at least 3 days prior to this appointment  - around 80% of individuals outgrow this allergy in ~ 10 years and carrying it as a diagnosis can prevent you from getting proper therapy if needed   Follow up : at your convenience for environmental allergy testing or penicillin testing (2 separate appointments). It was a pleasure meeting you in clinic today! Thank you for allowing me to participate in your care.  Tonny Bollman, MD [facility] 

## 2023-05-13 LAB — IGE PEANUT W/COMPONENT REFLEX

## 2023-05-13 NOTE — Progress Notes (Signed)
Please let her know that her peanut blood testing was also negative.  I suspect she is having more of an intolerance type reaction to the peanut butter.  I would avoid to avoid symptoms, but do not suspect she will need an epipen and am NOT concerned about the medication she needs to take that contains peanut oil.  Let me know if she has any questions.

## 2023-06-01 ENCOUNTER — Ambulatory Visit: Payer: BLUE CROSS/BLUE SHIELD | Admitting: Podiatry

## 2023-07-11 DIAGNOSIS — J00 Acute nasopharyngitis [common cold]: Secondary | ICD-10-CM | POA: Diagnosis not present

## 2023-07-20 DIAGNOSIS — J40 Bronchitis, not specified as acute or chronic: Secondary | ICD-10-CM | POA: Diagnosis not present

## 2023-08-22 ENCOUNTER — Emergency Department (HOSPITAL_BASED_OUTPATIENT_CLINIC_OR_DEPARTMENT_OTHER): Payer: BLUE CROSS/BLUE SHIELD

## 2023-08-22 ENCOUNTER — Other Ambulatory Visit: Payer: Self-pay

## 2023-08-22 ENCOUNTER — Encounter (HOSPITAL_BASED_OUTPATIENT_CLINIC_OR_DEPARTMENT_OTHER): Payer: Self-pay

## 2023-08-22 ENCOUNTER — Emergency Department (HOSPITAL_BASED_OUTPATIENT_CLINIC_OR_DEPARTMENT_OTHER)
Admission: EM | Admit: 2023-08-22 | Discharge: 2023-08-22 | Disposition: A | Discharge status: Home / Self Care | Payer: BLUE CROSS/BLUE SHIELD | Attending: Emergency Medicine | Admitting: Emergency Medicine

## 2023-08-22 DIAGNOSIS — Z7982 Long term (current) use of aspirin: Secondary | ICD-10-CM | POA: Diagnosis not present

## 2023-08-22 DIAGNOSIS — S0240DA Maxillary fracture, left side, initial encounter for closed fracture: Secondary | ICD-10-CM | POA: Diagnosis not present

## 2023-08-22 DIAGNOSIS — S02401A Maxillary fracture, unspecified, initial encounter for closed fracture: Secondary | ICD-10-CM | POA: Diagnosis not present

## 2023-08-22 DIAGNOSIS — S0993XA Unspecified injury of face, initial encounter: Secondary | ICD-10-CM | POA: Diagnosis not present

## 2023-08-22 DIAGNOSIS — R6884 Jaw pain: Secondary | ICD-10-CM | POA: Diagnosis not present

## 2023-08-22 DIAGNOSIS — R519 Headache, unspecified: Secondary | ICD-10-CM | POA: Diagnosis not present

## 2023-08-22 DIAGNOSIS — W010XXA Fall on same level from slipping, tripping and stumbling without subsequent striking against object, initial encounter: Secondary | ICD-10-CM | POA: Diagnosis not present

## 2023-08-22 MED ORDER — OXYCODONE-ACETAMINOPHEN 5-325 MG PO TABS: 1.0000 | ORAL_TABLET | Freq: Three times a day (TID) | ORAL | 0 refills | Status: DC | PRN

## 2023-08-22 NOTE — ED Provider Notes (Signed)
[facility] Provider Note   CSN: [id number] Arrival date & time: 08/22/23  1528     History  Chief Complaint  Patient presents with   Fall   Facial Injury    Stephanie Wagner is a 42 y.o. female.  With a history of previous MI, seasonal allergies presenting to the ED for evaluation of a facial injury.  She states she was chasing her daughter into the kitchen while she was wearing socks, slipped and fell onto the floor.  She landed on the left side of her face.  Did not lose consciousness.  Does not take any blood thinners.  She tried to brace herself with her left hand but could not get it up in time.  She reports pain to the left maxilla and zygoma.  Pain is worse with talking and chewing.  She denies any headaches, vision changes, nausea, vomiting, neck pain.  She denies any wrist pain.  No difficulty breathing through her nose.   Fall  Facial Injury      Home Medications Prior to Admission medications   Medication Sig Start Date End Date Taking? Authorizing Provider  oxyCODONE-acetaminophen (PERCOCET/ROXICET) 5-325 MG tablet Take 1 tablet by mouth every 8 (eight) hours as needed for severe pain (pain score 7-10). 08/22/23  Yes Kema Santaella, Edsel Petrin, PA-C  aspirin 81 MG EC tablet Take 1 tablet (81 mg total) by mouth daily. Patient not taking: Reported on 05/10/2023 06/25/20   Corky Crafts, MD  EPINEPHrine (EPIPEN 2-PAK) 0.3 mg/0.3 mL IJ SOAJ injection Inject 0.3 mg into the muscle as needed for anaphylaxis. 05/10/23   Verlee Monte, MD  metoprolol tartrate (LOPRESSOR) 25 MG tablet Take 1 tablet (25 mg total) by mouth 2 (two) times daily. 08/25/22   Corky Crafts, MD  Multiple Vitamin (MULTIVITAMIN WITH MINERALS) TABS tablet Take 1 tablet by mouth daily.    [provider]  nitroGLYCERIN (NITROSTAT) 0.4 MG SL tablet Place 1 tablet (0.4 mg total) under the tongue every 5 (five) minutes as needed for chest pain. Patient  not taking: Reported on 05/10/2023 06/25/20   Corky Crafts, MD      Allergies    Iodinated contrast media, Loratadine, Morphine and codeine, and Penicillins    Review of Systems   Review of Systems  Musculoskeletal:  Positive for myalgias.  All other systems reviewed and are negative.   Physical Exam Updated Vital Signs BP (!) 134/96 (BP Location: Right Arm)   Pulse 85   Temp (!) 97.3 F (36.3 C)   Resp 18   Ht 5\' 8"  (1.727 m)   Wt 68 kg   LMP 07/25/2023 (Approximate)   SpO2 100%   BMI 22.81 kg/m  Physical Exam Vitals and nursing note reviewed.  Constitutional:      General: She is not in acute distress.    Appearance: Normal appearance. She is normal weight. She is not ill-appearing.  HENT:     Head: Normocephalic and atraumatic.     Comments: No raccoon eyes, Battle sign or traumatic hyphema.  No bruising of the face.  Nares are patent bilaterally.  Mild TTP to the left zygoma.  No trismus. Pulmonary:     Effort: Pulmonary effort is normal. No respiratory distress.  Abdominal:     General: Abdomen is flat.  Musculoskeletal:        General: Normal range of motion.     Cervical back: Neck supple.  Skin:  General: Skin is warm and dry.  Neurological:     Mental Status: She is alert and oriented to person, place, and time.  Psychiatric:        Mood and Affect: Mood normal.        Behavior: Behavior normal.     ED Results / Procedures / Treatments   Labs (all labs ordered are listed, but only abnormal results are displayed) Labs Reviewed - No data to display  EKG None  Radiology CT Head Wo Contrast  Result Date: 08/22/2023 CLINICAL DATA:  Slipped and fell hit left side of face, jaw pain EXAM: CT HEAD WITHOUT CONTRAST CT MAXILLOFACIAL WITHOUT CONTRAST CT CERVICAL SPINE WITHOUT CONTRAST TECHNIQUE: Multidetector CT imaging of the head, cervical spine, and maxillofacial structures were performed using the standard protocol without intravenous contrast.  Multiplanar CT image reconstructions of the cervical spine and maxillofacial structures were also generated. RADIATION DOSE REDUCTION: This exam was performed according to the departmental dose-optimization program which includes automated exposure control, adjustment of the mA and/or kV according to patient size and/or use of iterative reconstruction technique. COMPARISON:  CT brain 04/14/2012, MRI 05/01/2020 FINDINGS: CT HEAD FINDINGS Brain: No evidence of acute infarction, hemorrhage, hydrocephalus, extra-axial collection or mass lesion/mass effect. Vascular: No hyperdense vessel or unexpected calcification. Skull: Normal. Negative for fracture or focal lesion. Other: None CT MAXILLOFACIAL FINDINGS Osseous: Mastoid air cells are clear. Mandibular heads are normally position. No mandibular fracture. Pterygoid plates and zygomatic arches appear intact. Possible tiny fracture at the tip of nasal bones. Age indeterminate osseous densities adjacent to the anterior nasal spine of maxilla. Orbits: Negative. No traumatic or inflammatory finding. Sinuses: Clear. Soft tissues: Negative. CT CERVICAL SPINE FINDINGS Alignment: No subluxation.  Facet alignment is within normal limits Skull base and vertebrae: No acute fracture. No primary bone lesion or focal pathologic process. Soft tissues and spinal canal: No prevertebral fluid or swelling. No visible canal hematoma. Disc levels:  Within normal limits Upper chest: Negative. Other: None IMPRESSION: 1. Negative non contrasted CT appearance of the brain. 2. Possible tiny fracture at the tip of nasal bones of uncertain age. Age indeterminate osseous densities adjacent to the anterior nasal spine of maxilla, correlate for point tenderness. 3. Negative CT appearance of the cervical spine. Electronically Signed   By: Jasmine Pang M.D.   On: 08/22/2023 16:54   CT Cervical Spine Wo Contrast  Result Date: 08/22/2023 CLINICAL DATA:  Slipped and fell hit left side of face, jaw  pain EXAM: CT HEAD WITHOUT CONTRAST CT MAXILLOFACIAL WITHOUT CONTRAST CT CERVICAL SPINE WITHOUT CONTRAST TECHNIQUE: Multidetector CT imaging of the head, cervical spine, and maxillofacial structures were performed using the standard protocol without intravenous contrast. Multiplanar CT image reconstructions of the cervical spine and maxillofacial structures were also generated. RADIATION DOSE REDUCTION: This exam was performed according to the departmental dose-optimization program which includes automated exposure control, adjustment of the mA and/or kV according to patient size and/or use of iterative reconstruction technique. COMPARISON:  CT brain 04/14/2012, MRI 05/01/2020 FINDINGS: CT HEAD FINDINGS Brain: No evidence of acute infarction, hemorrhage, hydrocephalus, extra-axial collection or mass lesion/mass effect. Vascular: No hyperdense vessel or unexpected calcification. Skull: Normal. Negative for fracture or focal lesion. Other: None CT MAXILLOFACIAL FINDINGS Osseous: Mastoid air cells are clear. Mandibular heads are normally position. No mandibular fracture. Pterygoid plates and zygomatic arches appear intact. Possible tiny fracture at the tip of nasal bones. Age indeterminate osseous densities adjacent to the anterior nasal spine of maxilla. Orbits: Negative.  No traumatic or inflammatory finding. Sinuses: Clear. Soft tissues: Negative. CT CERVICAL SPINE FINDINGS Alignment: No subluxation.  Facet alignment is within normal limits Skull base and vertebrae: No acute fracture. No primary bone lesion or focal pathologic process. Soft tissues and spinal canal: No prevertebral fluid or swelling. No visible canal hematoma. Disc levels:  Within normal limits Upper chest: Negative. Other: None IMPRESSION: 1. Negative non contrasted CT appearance of the brain. 2. Possible tiny fracture at the tip of nasal bones of uncertain age. Age indeterminate osseous densities adjacent to the anterior nasal spine of maxilla,  correlate for point tenderness. 3. Negative CT appearance of the cervical spine. Electronically Signed   By: Jasmine Pang M.D.   On: 08/22/2023 16:54   CT Maxillofacial Wo Contrast  Result Date: 08/22/2023 CLINICAL DATA:  Slipped and fell hit left side of face, jaw pain EXAM: CT HEAD WITHOUT CONTRAST CT MAXILLOFACIAL WITHOUT CONTRAST CT CERVICAL SPINE WITHOUT CONTRAST TECHNIQUE: Multidetector CT imaging of the head, cervical spine, and maxillofacial structures were performed using the standard protocol without intravenous contrast. Multiplanar CT image reconstructions of the cervical spine and maxillofacial structures were also generated. RADIATION DOSE REDUCTION: This exam was performed according to the departmental dose-optimization program which includes automated exposure control, adjustment of the mA and/or kV according to patient size and/or use of iterative reconstruction technique. COMPARISON:  CT brain 04/14/2012, MRI 05/01/2020 FINDINGS: CT HEAD FINDINGS Brain: No evidence of acute infarction, hemorrhage, hydrocephalus, extra-axial collection or mass lesion/mass effect. Vascular: No hyperdense vessel or unexpected calcification. Skull: Normal. Negative for fracture or focal lesion. Other: None CT MAXILLOFACIAL FINDINGS Osseous: Mastoid air cells are clear. Mandibular heads are normally position. No mandibular fracture. Pterygoid plates and zygomatic arches appear intact. Possible tiny fracture at the tip of nasal bones. Age indeterminate osseous densities adjacent to the anterior nasal spine of maxilla. Orbits: Negative. No traumatic or inflammatory finding. Sinuses: Clear. Soft tissues: Negative. CT CERVICAL SPINE FINDINGS Alignment: No subluxation.  Facet alignment is within normal limits Skull base and vertebrae: No acute fracture. No primary bone lesion or focal pathologic process. Soft tissues and spinal canal: No prevertebral fluid or swelling. No visible canal hematoma. Disc levels:  Within  normal limits Upper chest: Negative. Other: None IMPRESSION: 1. Negative non contrasted CT appearance of the brain. 2. Possible tiny fracture at the tip of nasal bones of uncertain age. Age indeterminate osseous densities adjacent to the anterior nasal spine of maxilla, correlate for point tenderness. 3. Negative CT appearance of the cervical spine. Electronically Signed   By: Jasmine Pang M.D.   On: 08/22/2023 16:54    Procedures Procedures    Medications Ordered in ED Medications - No data to display  ED Course/ Medical Decision Making/ A&P                                 Medical Decision Making Amount and/or Complexity of Data Reviewed Radiology: ordered.  This patient presents to the ED for concern of fall, facial injury, this involves an extensive number of treatment options, and is a complaint that carries with it a high risk of complications and morbidity.  The differential diagnosis includes fracture, contusion, strain  My initial workup includes imaging  Additional history obtained from: Nursing notes from this visit.  I ordered imaging studies including CT head, C-spine, maxillofacial I independently visualized and interpreted imaging which showed possible tiny fracture at the tip of  the nasal bones.  Likely fracture adjacent to the anterior nasal spine of the maxilla I agree with the radiologist interpretation  Afebrile, hemodynamically stable.  42 year old female presenting to the ED for evaluation of a fall.  This is a mechanical fall.  She landed on the left side of her face.  She has pain to the left zygoma and maxilla.  It is worse with chewing and talking.  She denies any other symptoms.  She has not anticoagulated.  CT imaging revealed possible tiny nasal bone fracture and left maxilla fracture.  She was given contact information for maxillofacial surgery and encouraged to follow-up.  She was sent a prescription for Percocet and educated on potential side effects.  She  was given return precautions.  Stable at discharge.  At this time there does not appear to be any evidence of an acute emergency medical condition and the patient appears stable for discharge with appropriate outpatient follow up. Diagnosis was discussed with patient who verbalizes understanding of care plan and is agreeable to discharge. I have discussed return precautions with patient who verbalizes understanding. Patient encouraged to follow-up with their PCP within 1 week. All questions answered.  Note: Portions of this report may have been transcribed using voice recognition software. Every effort was made to ensure accuracy; however, inadvertent computerized transcription errors may still be present.        Final Clinical Impression(s) / ED Diagnoses Final diagnoses:  Closed fracture of left side of maxilla, initial encounter [facility])    Rx / DC Orders ED Discharge Orders          Ordered    oxyCODONE-acetaminophen (PERCOCET/ROXICET) 5-325 MG tablet  Every 8 hours PRN        08/22/23 1753              Michelle Piper, PA-C 08/22/23 1753    Arby Barrette, MD 08/22/23 2151

## 2023-08-22 NOTE — Discharge Instructions (Signed)
You have been seen today for your complaint of left-sided facial pain after a fall. Your imaging was concerning for potential nasal bone fracture and left maxilla fracture. Your discharge medications include Percocet. This is an opioid pain medication. You should only take this medication as needed for severe pain. You should not drive, operate heavy machinery or make important decisions while taking this medication. You should use alternative methods for pain relief while taking this medication including stretching, gentle range of motion, and alternating tylenol and ibuprofen.  Do not take additional Tylenol while taking the Percocet. Home care instructions are as follows:  Ice the area Follow up with: The office of Dr. Jearld Fenton.  He is a Investment banker, corporate.  Call to schedule a follow-up appointment Please seek immediate medical care if you develop any of the following symptoms: You have trouble breathing. You have a sudden increase in swelling. You need to cut the wires off your teeth because of vomiting. At this time there does not appear to be the presence of an emergent medical condition, however there is always the potential for conditions to change. Please read and follow the below instructions.  Do not take your medicine if  develop an itchy rash, swelling in your mouth or lips, or difficulty breathing; call 911 and seek immediate emergency medical attention if this occurs.  You may review your lab tests and imaging results in their entirety on your MyChart account.  Please discuss all results of fully with your primary care provider and other specialist at your follow-up visit.  Note: Portions of this text may have been transcribed using voice recognition software. Every effort was made to ensure accuracy; however, inadvertent computerized transcription errors may still be present.

## 2023-08-22 NOTE — ED Notes (Signed)

## 2023-08-22 NOTE — ED Notes (Signed)
Spoke with provider and they gave verbal orders.

## 2023-08-22 NOTE — ED Triage Notes (Signed)
The patient slipped and fell today. She hit the keft side of her face on the ground. She is having jaw pain on the and left wrist pain. No Loc and no blood thinner use.

## 2023-08-26 ENCOUNTER — Telehealth (INDEPENDENT_AMBULATORY_CARE_PROVIDER_SITE_OTHER): Payer: Self-pay | Admitting: Otolaryngology

## 2023-08-26 NOTE — Telephone Encounter (Signed)
Spoke with patient, confirmed location

## 2023-08-27 ENCOUNTER — Encounter (INDEPENDENT_AMBULATORY_CARE_PROVIDER_SITE_OTHER): Payer: Self-pay

## 2023-08-27 ENCOUNTER — Ambulatory Visit (INDEPENDENT_AMBULATORY_CARE_PROVIDER_SITE_OTHER): Payer: BLUE CROSS/BLUE SHIELD | Admitting: Otolaryngology

## 2023-08-27 VITALS — Ht 68.0 in | Wt 150.0 lb

## 2023-08-27 DIAGNOSIS — S022XXA Fracture of nasal bones, initial encounter for closed fracture: Secondary | ICD-10-CM

## 2023-08-27 DIAGNOSIS — W19XXXA Unspecified fall, initial encounter: Secondary | ICD-10-CM

## 2023-08-28 DIAGNOSIS — S022XXA Fracture of nasal bones, initial encounter for closed fracture: Secondary | ICD-10-CM | POA: Insufficient documentation

## 2023-08-28 NOTE — Progress Notes (Signed)
Patient ID: Stephanie Wagner, female   DOB: 1980-11-06, 42 y.o.   MRN: [id number]  CC: Facial trauma, nasal fracture  HPI:  Stephanie Wagner is a 42 y.o. female who presents today for evaluation of her facial trauma.  According to the patient, she had an accidental fall 5 days ago, hitting her face on the ground.  Close CT scan showed a possible tiny fracture at the tip of nasal bones.  No other facial fractures were noted.  The patient presents today complaining of tenderness over her left midface.  She is able to breathe through both nostrils.  She denies any visual change.  She has no previous ENT surgery.  Past Medical History:  Diagnosis Date   Back pain    Chronic headaches    Environmental allergies    animals, dander   Migraine    Myocardial infarct (HCC)    Neck pain    Seasonal allergies    Tachycardia    Urticaria     Past Surgical History:  Procedure Laterality Date   CORONARY/GRAFT ACUTE MI REVASCULARIZATION N/A 12/09/2018   Procedure: CORONARY/GRAFT ACUTE MI REVASCULARIZATION;  Surgeon: Yvonne Kendall, MD;  Location: [facility];  Service: Cardiovascular;  Laterality: N/A;   DILATION AND CURETTAGE OF UTERUS  08/31/2014   Procedure: Dilatation and Currettage with Ultrasound Guidance;  Surgeon: Sharon Seller, DO;  Location: [facility];  Service: Gynecology;;   LEFT HEART CATH AND CORONARY ANGIOGRAPHY N/A 12/09/2018   Procedure: LEFT HEART CATH AND CORONARY ANGIOGRAPHY;  Surgeon: Yvonne Kendall, MD;  Location: [facility];  Service: Cardiovascular;  Laterality: N/A;   WISDOM TOOTH EXTRACTION      Family History  Problem Relation Age of Onset   Thyroid disease Mother    Allergic rhinitis Father    Diabetes Father    Kidney disease Father        transplant   Breast cancer Maternal Grandmother    Heart disease Maternal Grandfather    Cancer Maternal Grandfather        testicular    Social History:  reports that she has never smoked. She has never been  exposed to tobacco smoke. She has never used smokeless tobacco. She reports that she does not drink alcohol and does not use drugs.  Allergies:  Allergies  Allergen Reactions   Iodinated Contrast Media Shortness Of Breath    Dyspnea and rash during heart catheterization Dyspnea and rash during heart catheterization   Loratadine Palpitations and Other (See Comments)    Just during pregnancy   Morphine And Codeine Nausea And Vomiting   Penicillins Nausea And Vomiting and Rash    Did it involve swelling of the face/tongue/throat, SOB, or low BP? No Did it involve sudden or severe rash/hives, skin peeling, or any reaction on the inside of your mouth or nose? Yes Did you need to seek medical attention at a hospital or doctor's office? No When did it last happen?      childhood If all above answers are "NO", may proceed with cephalosporin use.     Prior to Admission medications   Medication Sig Start Date End Date Taking? Authorizing Provider  metoprolol tartrate (LOPRESSOR) 25 MG tablet Take 1 tablet (25 mg total) by mouth 2 (two) times daily. 08/25/22  Yes Corky Crafts, MD  Multiple Vitamin (MULTIVITAMIN WITH MINERALS) TABS tablet Take 1 tablet by mouth daily.   Yes [provider]  aspirin 81 MG EC tablet Take 1 tablet (81  mg total) by mouth daily. Patient not taking: Reported on 05/10/2023 06/25/20   Corky Crafts, MD  EPINEPHrine (EPIPEN 2-PAK) 0.3 mg/0.3 mL IJ SOAJ injection Inject 0.3 mg into the muscle as needed for anaphylaxis. Patient not taking: Reported on 08/27/2023 05/10/23   Verlee Monte, MD  nitroGLYCERIN (NITROSTAT) 0.4 MG SL tablet Place 1 tablet (0.4 mg total) under the tongue every 5 (five) minutes as needed for chest pain. Patient not taking: Reported on 05/10/2023 06/25/20   Corky Crafts, MD  oxyCODONE-acetaminophen (PERCOCET/ROXICET) 5-325 MG tablet Take 1 tablet by mouth every 8 (eight) hours as needed for severe pain (pain score  7-10). Patient not taking: Reported on 08/27/2023 08/22/23   Michelle Piper, PA-C   Height 5\' 8"  (1.727 m), weight 150 lb (68 kg), last menstrual period 07/25/2023, unknown if currently breastfeeding. Exam: General: Communicates without difficulty, well nourished, no acute distress. Head: Normocephalic, no evidence injury, no tenderness, facial buttresses intact without stepoff. Face/sinus: Slight tenderness to palpation and percussion over the left midface. Facial movement is normal and symmetric. Eyes: PERRL, EOMI. No scleral icterus, conjunctivae clear. Neuro: CN II exam reveals vision grossly intact.  No nystagmus at any point of gaze. Ears: Auricles well formed without lesions.  Ear canals are intact without mass or lesion.  No erythema or edema is appreciated.  The TMs are intact without fluid. Nose: External evaluation reveals normal support and skin without lesions.  Dorsum is intact.  Anterior rhinoscopy reveals mildly congested mucosa over anterior aspect of inferior turbinates and intact septum.  No purulence noted. Oral:  Oral cavity and oropharynx are intact, symmetric, without erythema or edema.  Mucosa is moist without lesions. Neck: Full range of motion without pain.  There is no significant lymphadenopathy.  No masses palpable.  Thyroid bed within normal limits to palpation.  Parotid glands and submandibular glands equal bilaterally without mass.  Trachea is midline. Neuro:  CN 2-12 grossly intact.    Assessment: 1.  Recent facial trauma, with possible tiny fracture at the tip of her nasal bones. 2.  No nasal deformity or functional deficit is noted today.  Plan: 1.  The physical exam findings and the CT images are reviewed with the patient. 2.  Based on the above findings, the decision is made to proceed with conservative observation. 3.  Cool compresses for edema and pain control. 4.  The patient is encouraged to call with any questions or concerns.  Shey Bartmess W Ashaya Raftery 08/28/2023, 6:53  AM

## 2023-09-04 ENCOUNTER — Other Ambulatory Visit: Payer: Self-pay | Admitting: Interventional Cardiology

## 2023-09-04 DIAGNOSIS — J22 Unspecified acute lower respiratory infection: Secondary | ICD-10-CM | POA: Diagnosis not present

## 2023-09-06 ENCOUNTER — Telehealth: Payer: Self-pay | Admitting: Interventional Cardiology

## 2023-09-06 MED ORDER — METOPROLOL TARTRATE 25 MG PO TABS: 25.0000 mg | ORAL_TABLET | Freq: Two times a day (BID) | ORAL | 0 refills | Status: DC

## 2023-09-06 NOTE — Telephone Encounter (Signed)
*  STAT* If patient is at the pharmacy, call can be transferred to refill team.   1. Which medications need to be refilled? (please list name of each medication and dose if known)   metoprolol tartrate (LOPRESSOR) 25 MG tablet   2. Would you like to learn more about the convenience, safety, & potential cost savings by using the [facility]?   3. Are you open to using the [facility] (Type [facility]. ).  4. Which pharmacy/location (including street and city if local pharmacy) is medication to be sent to?  [id number] - [place], [place] - [address]   5. Do they need a 30 day or 90 day supply?   90 day  Patient stated she is completely out of this medication.  Patient has appointment on 11/03/23.

## 2023-09-06 NOTE — Telephone Encounter (Signed)
 RX sent to requested Pharmacy

## 2023-10-15 ENCOUNTER — Other Ambulatory Visit: Payer: Self-pay | Admitting: Obstetrics and Gynecology

## 2023-10-15 DIAGNOSIS — Z1231 Encounter for screening mammogram for malignant neoplasm of breast: Secondary | ICD-10-CM

## 2023-10-31 ENCOUNTER — Encounter: Payer: Self-pay | Admitting: Cardiovascular Disease

## 2023-10-31 NOTE — Progress Notes (Signed)
  Cardiology Office Note:  .   Date:  11/03/2023  ID:  Stephanie Wagner, DOB 08-17-1981, MRN [id number] PCP: Naoma Bacca., MD  [facility] Providers Cardiologist:  Avery Bodo, MD {   History of Present Illness: .    Jan. 15, 2025  Stephanie Wagner is a 43 y.o. female former patient of Dr. Jacquelynn Matter.  She has a hx of SCAD in 2020  She was on ASA a day but stopped after several years due to excessive menstral bleeding   Works at [employer] at [employer] center   Is very active at work ,  putting together displays,     ROS:   Studies Reviewed: Aaron Aas   EKG Interpretation Date/Time:  Wednesday November 03 2023 16:24:27 EST Ventricular Rate:  83 PR Interval:  138 QRS Duration:  76 QT Interval:  360 QTC Calculation: 423 R Axis:   57  Text Interpretation: Normal sinus rhythm Normal ECG When compared with ECG of 01-Feb-2019 08:32, PREVIOUS ECG IS PRESENT Confirmed by Ahmad Alert ([id number]) on 11/03/2023 4:32:33 PM     Risk Assessment/Calculations:             Physical Exam:   VS:  BP 134/72   Pulse 81   Ht 5\' 8"  (1.727 m)   Wt 154 lb 6.4 oz (70 kg)   SpO2 99%   BMI 23.48 kg/m    Wt Readings from Last 3 Encounters:  11/03/23 154 lb 6.4 oz (70 kg)  08/27/23 150 lb (68 kg)  08/22/23 150 lb (68 kg)    GEN: Well nourished, well developed in no acute distress NECK: No JVD; No carotid bruits CARDIAC: RRR, no murmurs, rubs, gallops RESPIRATORY:  Clear to auscultation without rales, wheezing or rhonchi  ABDOMEN: Soft, non-tender, non-distended EXTREMITIES:  No edema; No deformity   ASSESSMENT AND PLAN: .   Spontaneous coronary artery dissection.  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  She has not had any recurrent episodes of chest discomfort.  She took dual antiplatelet therapy for about a month and then took aspirin  for years ago.  The aspirin  was causing her to have heavy menstrual bleeding so she  stopped the aspirin .  She has remained very stable.  Given her the okay to start lifting some very light dumbbells and her workouts.  She is quite active at work.  I encouraged her to continue to get good cardio exercise.  Will have her follow-up in 1 year for follow-up visit.       Dispo: 1 year .  She would like to research our group to choose who she wants to follow up with .   Signed, Ahmad Alert, MD

## 2023-11-03 ENCOUNTER — Ambulatory Visit: Payer: BLUE CROSS/BLUE SHIELD | Attending: Cardiovascular Disease | Admitting: Cardiovascular Disease

## 2023-11-03 VITALS — BP 134/72 | HR 81 | Ht 68.0 in | Wt 154.4 lb

## 2023-11-03 DIAGNOSIS — I2102 ST elevation (STEMI) myocardial infarction involving left anterior descending coronary artery: Secondary | ICD-10-CM | POA: Diagnosis not present

## 2023-11-03 MED ORDER — METOPROLOL TARTRATE 25 MG PO TABS: 25.0000 mg | ORAL_TABLET | Freq: Two times a day (BID) | ORAL | 3 refills | Status: DC

## 2023-11-03 NOTE — Patient Instructions (Addendum)
 Follow-Up: At [facility], you and your health needs are our priority.  As part of our continuing mission to provide you with exceptional heart care, we have created designated Provider Care Teams.  These Care Teams include your primary Cardiologist (physician) and Advanced Practice Providers (APPs -  Physician Assistants and Nurse Practitioners) who all work together to provide you with the care you need, when you need it.  We recommend signing up for the patient portal called "MyChart".  Sign up information is provided on this After Visit Summary.  MyChart is used to connect with patients for Virtual Visits (Telemedicine).  Patients are able to view lab/test results, encounter notes, upcoming appointments, etc.  Non-urgent messages can be sent to your provider as well.   To learn more about what you can do with MyChart, go to [email or web address].    Your next appointment:   1 year(s)  Provider:   Dr. Veryl Gottron Dr. Theodis Fiscal Dr. Rolm Clos  Dr. Abel Hoe    1st Floor: - Lobby - Registration  - Pharmacy  - Lab - Cafe  2nd Floor: - PV Lab - Diagnostic Testing (echo, CT, nuclear med)  3rd Floor: - Vacant  4th Floor: - TCTS (cardiothoracic surgery) - AFib Clinic - Structural Heart Clinic - Vascular Surgery  - Vascular Ultrasound  5th Floor: - HeartCare Cardiology (general and EP) - Clinical Pharmacy for coumadin, hypertension, lipid, weight-loss medications, and med management appointments    Valet parking services will be available as well.

## 2023-12-20 ENCOUNTER — Other Ambulatory Visit: Payer: Self-pay | Admitting: Obstetrics and Gynecology

## 2023-12-20 DIAGNOSIS — Z1231 Encounter for screening mammogram for malignant neoplasm of breast: Secondary | ICD-10-CM

## 2023-12-20 DIAGNOSIS — Z1330 Encounter for screening examination for mental health and behavioral disorders, unspecified: Secondary | ICD-10-CM | POA: Diagnosis not present

## 2023-12-20 DIAGNOSIS — N8003 Adenomyosis of the uterus: Secondary | ICD-10-CM | POA: Diagnosis not present

## 2023-12-20 DIAGNOSIS — Z01419 Encounter for gynecological examination (general) (routine) without abnormal findings: Secondary | ICD-10-CM | POA: Diagnosis not present

## 2023-12-20 DIAGNOSIS — N946 Dysmenorrhea, unspecified: Secondary | ICD-10-CM | POA: Diagnosis not present

## 2024-01-20 ENCOUNTER — Ambulatory Visit

## 2024-02-11 ENCOUNTER — Encounter: Payer: Self-pay | Admitting: Gastroenterology

## 2024-02-11 ENCOUNTER — Ambulatory Visit: Admitting: Gastroenterology

## 2024-02-11 VITALS — BP 100/70 | HR 93 | Ht 68.0 in | Wt 156.0 lb

## 2024-02-11 DIAGNOSIS — R09A2 Foreign body sensation, throat: Secondary | ICD-10-CM

## 2024-02-11 NOTE — Progress Notes (Signed)
 [facility] Consult Note:  History: Stephanie Wagner 02/11/2024  Referring provider: Naoma Bacca., MD  Reason for consult/chief complaint: Foreign body sensation, throat discomfort   Subjective  HPI: Stephanie Wagner was in an ED in [facility]  on 02/04/24 with foreign body sensation in the throat after consuming sushi.  Provider report indicates she thought there may have been part of a crab shell with it that was either stuck or scratched the inside of her throat.  She was discharged without upper endoscopy.  Today, patient states she continues to have throat discomfort and sensation something is stuck there, one week later. The discomfort began after she took her first bite of sushi, and after inspecting the rest of the food she found several pieces of crab shell. She tried eating bread to dislodge the foreign body, but this did not help. She went to the ED for evaluation, where a soft tissue x-ray of the neck was performed and did not reveal any foreign body.   She denies throat pain but has a sensation something is still there which is persistent and uncomfortable, and gets worse when she swallows. This sensation has been worsening over the past week and not improving. She did not try taking the Maalox prescribed in the ED.   She has noticed increased coughing/throat clearing, as well as mucus in her throat, which feels "congested." She denies any difficulty breathing, shortness of breath, or chest pain.   She denies similar previous episodes, denies any trouble with swallowing in the past. She has occasional indigestion/reflux which occurs less than weekly and can be linked to something she eats.   Patient follows with cardiology annually due to history of SCAD (spontaneous coronary artery dissection) in 2020, for which she now takes metoprolol . Denies use of blood thinners. Denies difficulty with anesthesia in the past.   She is not having any other GI symptoms  at this time, denies change in normal bowel habits.   No prior upper endoscopy.   ROS:   Review of Systems  Constitutional:  Negative for chills and fever.  Respiratory:  Negative for hemoptysis, shortness of breath and stridor.   Cardiovascular:  Negative for chest pain and palpitations.  Gastrointestinal:  Negative for abdominal pain, blood in stool, constipation, diarrhea, heartburn, melena, nausea and vomiting.     Past Medical History: Past Medical History:  Diagnosis Date   Back pain    Chronic headaches    Environmental allergies    animals, dander   Migraine    Myocardial infarct [facility])    Neck pain    Seasonal allergies    Tachycardia    Urticaria    From last cardiology office note January 2025: "Spontaneous coronary artery dissection.  Stephanie Wagner is seen for follow-up of her spontaneous coronary artery dissection that occurred 5 years ago.  She has not had any recurrent episodes of chest discomfort.  She took dual antiplatelet therapy for about a month and then took aspirin  for years ago.  The aspirin  was causing her to have heavy menstrual bleeding so she stopped the aspirin .  She has remained very stable."  Past Surgical History: Past Surgical History:  Procedure Laterality Date   CORONARY/GRAFT ACUTE MI REVASCULARIZATION N/A 12/09/2018   Procedure: CORONARY/GRAFT ACUTE MI REVASCULARIZATION;  Surgeon: Sammy Crisp, MD;  [facility];  Service: Cardiovascular;  Laterality: N/A;   DILATION AND CURETTAGE OF UTERUS  08/31/2014   Procedure: Dilatation and Currettage with Ultrasound Guidance;  Surgeon:  Christel Cousins, DO;  Location: [facility] OR[facility];  Service: Gynecology;;   LEFT HEART CATH AND CORONARY ANGIOGRAPHY N/A 12/09/2018   Procedure: LEFT HEART CATH AND CORONARY ANGIOGRAPHY;  Surgeon: Sammy Crisp, MD;  Location: [facility];  Service: Cardiovascular;  Laterality: N/A;   WISDOM TOOTH EXTRACTION       Family History: Family History   Problem Relation Age of Onset   Thyroid disease Mother    Allergic rhinitis Father    Diabetes Father    Kidney disease Father        transplant   Breast cancer Maternal Grandmother    Heart disease Maternal Grandfather    Cancer Maternal Grandfather        testicular    Social History: Social History   Socioeconomic History   Marital status: Married    Spouse name: Not on file   Number of children: Not on file   Years of education: Not on file   Highest education level: Not on file  Occupational History   Not on file  Tobacco Use   Smoking status: Never    Passive exposure: Never   Smokeless tobacco: Never  Vaping Use   Vaping status: Never Used  Substance and Sexual Activity   Alcohol use: No   Drug use: No   Sexual activity: Yes    Partners: Male    Birth control/protection: None  Other Topics Concern   Not on file  Social History Narrative   Not on file   Social Drivers of Health   Financial Resource Strain: Not on file  Food Insecurity: Low Risk  (09/06/2023)   Received from [facility]   Hunger Vital Sign    Worried About Running Out of Food in the Last Year: Never true    Ran Out of Food in the Last Year: Never true  Transportation Needs: No Transportation Needs (09/06/2023)   Received from [facility]    In the past 12 months, has lack of reliable transportation kept you from medical appointments, meetings, work or from getting things needed for daily living? : No  Physical Activity: Not on file  Stress: Not on file  Social Connections: Not on file    Allergies: Allergies  Allergen Reactions   Iodinated Contrast Media Shortness Of Breath    Dyspnea and rash during heart catheterization Dyspnea and rash during heart catheterization   Loratadine Palpitations and Other (See Comments)    Just during pregnancy   Morphine  And Codeine Nausea And Vomiting   Penicillins Nausea And Vomiting and Rash    Did it involve swelling of  the face/tongue/throat, SOB, or low BP? No Did it involve sudden or severe rash/hives, skin peeling, or any reaction on the inside of your mouth or nose? Yes Did you need to seek medical attention at a hospital or doctor's office? No When did it last happen?      childhood If all above answers are "NO", may proceed with cephalosporin use.     Outpatient Meds: Current Outpatient Medications  Medication Sig Dispense Refill   metoprolol  tartrate (LOPRESSOR ) 25 MG tablet Take 1 tablet (25 mg total) by mouth 2 (two) times daily. 180 tablet 3   Multiple Vitamin (MULTIVITAMIN WITH MINERALS) TABS tablet Take 1 tablet by mouth daily.     nitroGLYCERIN  (NITROSTAT ) 0.4 MG SL tablet Place 1 tablet (0.4 mg total) under the tongue every 5 (five) minutes as needed for chest pain. 25 tablet 3  No current facility-administered medications for this visit.      ___________________________________________________________________ Objective   Exam:  There were no vitals taken for this visit. Wt Readings from Last 3 Encounters:  11/03/23 154 lb 6.4 oz (70 kg)  08/27/23 150 lb (68 kg)  08/22/23 150 lb (68 kg)    General: Well-appearing, well-nourished, no acute distress Eyes: sclera anicteric, no redness ENT: oral mucosa moist without lesions, no cervical or supraclavicular lymphadenopathy CV: RRR, no JVD, no peripheral edema Resp: clear to auscultation bilaterally, normal RR and effort noted GI: soft, non-tender, with active bowel sounds. No guarding or palpable organomegaly noted. Skin; warm and dry, no rash or jaundice noted Neuro: awake, alert and oriented x 3. Normal gross motor function and fluent speech   Radiologic Studies:  Had a normal XR neck soft tissue lateral in the ED on 02/04/2024.  Assessment:  Foreign body sensation, throat Patient with one week of worsening foreign body sensation/discomfort in her throat after swallowing what she thinks was a piece of crab shell. Has had  increased throat clearing/coughing and mucus production. No nausea/vomiting, no difficulty swallowing, no shortness of breath or chest pain. Nothing is visible on exam today.    Plan:  Will refer to [facility] for laryngoscopy. After their evaluation, if nothing is found, could consider a barium swallow study.  (Messaged Dr. Virgina Grills at [facility], and he will help expedite a visit for this patient)  Patient encouraged to contact us  later this year or whenever she is ready for her screening colonoscopy.  Thank you for the courtesy of this consult.  Please call me with any questions or concerns.  Kerby Pearson III  CC: Referring provider noted above

## 2024-02-11 NOTE — Patient Instructions (Addendum)
 [facility] [place] Located in: [facility] Address: [address], [place], [place] [address] Phone: [phone]  _______________________________________________________  If your blood pressure at your visit was 140/90 or greater, please contact your primary care physician to follow up on this.  _______________________________________________________  If you are age 75 or older, your body mass index should be between 23-30. Your Body mass index is 23.72 kg/m. If this is out of the aforementioned range listed, please consider follow up with your Primary Care Provider.  If you are age 99 or younger, your body mass index should be between 19-25. Your Body mass index is 23.72 kg/m. If this is out of the aformentioned range listed, please consider follow up with your Primary Care Provider.   ________________________________________________________  The [facility] providers would like to encourage you to use MYCHART to communicate with providers for non-urgent requests or questions.  Due to long hold times on the telephone, sending your provider a message by [facility] may be a faster and more efficient way to get a response.  Please allow 48 business hours for a response.  Please remember that this is for non-urgent requests.  _______________________________________________________

## 2024-02-15 ENCOUNTER — Ambulatory Visit
Admission: RE | Admit: 2024-02-15 | Discharge: 2024-02-15 | Disposition: A | Discharge status: Home / Self Care | Source: Ambulatory Visit | Attending: Obstetrics and Gynecology | Admitting: Obstetrics and Gynecology

## 2024-02-15 DIAGNOSIS — R070 Pain in throat: Secondary | ICD-10-CM | POA: Diagnosis not present

## 2024-02-15 DIAGNOSIS — Z1231 Encounter for screening mammogram for malignant neoplasm of breast: Secondary | ICD-10-CM | POA: Diagnosis not present

## 2024-03-08 DIAGNOSIS — E782 Mixed hyperlipidemia: Secondary | ICD-10-CM | POA: Diagnosis not present

## 2024-03-08 DIAGNOSIS — N926 Irregular menstruation, unspecified: Secondary | ICD-10-CM | POA: Diagnosis not present

## 2024-03-08 DIAGNOSIS — Z0000 Encounter for general adult medical examination without abnormal findings: Secondary | ICD-10-CM | POA: Diagnosis not present

## 2024-04-03 ENCOUNTER — Encounter (HOSPITAL_COMMUNITY): Payer: Self-pay

## 2024-04-03 ENCOUNTER — Ambulatory Visit (HOSPITAL_COMMUNITY)
Admission: EM | Admit: 2024-04-03 | Discharge: 2024-04-03 | Disposition: A | Discharge status: Home / Self Care | Attending: Internal Medicine | Admitting: Internal Medicine

## 2024-04-03 DIAGNOSIS — Z23 Encounter for immunization: Secondary | ICD-10-CM

## 2024-04-03 DIAGNOSIS — S91112A Laceration without foreign body of left great toe without damage to nail, initial encounter: Secondary | ICD-10-CM | POA: Diagnosis not present

## 2024-04-03 MED ORDER — BACITRACIN ZINC 500 UNIT/GM EX OINT
TOPICAL_OINTMENT | CUTANEOUS | Status: AC
  Filled 2024-04-03: qty 9

## 2024-04-03 MED ORDER — TETANUS-DIPHTH-ACELL PERTUSSIS 5-2.5-18.5 LF-MCG/0.5 IM SUSY
PREFILLED_SYRINGE | INTRAMUSCULAR | Status: AC
Start: 2024-04-03 — End: 2024-04-03
  Filled 2024-04-03: qty 0.5

## 2024-04-03 MED ORDER — TETANUS-DIPHTH-ACELL PERTUSSIS 5-2.5-18.5 LF-MCG/0.5 IM SUSY
0.5000 mL | PREFILLED_SYRINGE | Freq: Once | INTRAMUSCULAR | Status: AC
Start: 2024-04-03 — End: 2024-04-03
  Administered 2024-04-03: 0.5 mL via INTRAMUSCULAR

## 2024-04-03 MED ORDER — BACITRACIN ZINC 500 UNIT/GM EX OINT
TOPICAL_OINTMENT | Freq: Once | CUTANEOUS | Status: AC
  Administered 2024-04-03: 1 via TOPICAL

## 2024-04-03 NOTE — Discharge Instructions (Addendum)
 We updated your tetanus injection today. We applied a dressing in the clinic tonight to your wound.  You may keep this on tonight, then take off in the morning. Keep wound covered during the day with Aquaphor ointment and a bandage. Keep wound open to air at nighttime to allow to breathe.  Watch for signs of infection such as redness, swelling, pus, pain, and streaking redness to the foot.  If you develop any new or worsening symptoms or if your symptoms do not start to improve, please return here or follow-up with your primary care provider. If your symptoms are severe, please go to the emergency room.

## 2024-04-03 NOTE — ED Provider Notes (Signed)
 [facility]    CSN: [id number] Arrival date & time: 04/03/24  1744      History   Chief Complaint No chief complaint on file.   HPI Stephanie Wagner is a 43 y.o. female.   Patient presents to urgent care for evaluation of laceration to the left great toe and the left second toe that happened yesterday while she was at work.  She was wearing sandals when she accidentally stubbed her toe on the metal door frame causing laceration to the lateral nail fold of the distal left great toe.  She also has a small abrasion to the left second toe.  Wounds blood initially, bleeding controlled with pressure.  Denies numbness and tingling distally to injuries.  Ambulatory since injury without difficulty.  No drainage from the wounds or redness/swelling surrounding wound.  She is unsure of the date of her last tetanus injection and will need this to be updated today.  She was able to rinse wound well yesterday immediately after injury.     Past Medical History:  Diagnosis Date   Back pain    Chronic headaches    Environmental allergies    animals, dander   Migraine    Myocardial infarct [facility])    Neck pain    Seasonal allergies    Tachycardia    Urticaria     Patient Active Problem List   Diagnosis Date Noted   Nasal bones, closed fracture 08/28/2023   STEMI involving left anterior descending coronary artery (HCC) 12/09/2018   ST elevation myocardial infarction involving left anterior descending (LAD) coronary artery (HCC)    Diastasis of muscle 12/30/2017   Microscopic hematuria 01/28/2017   GAD (generalized anxiety disorder) 08/24/2016   Other fatigue 08/24/2016   Other insomnia 08/24/2016   Panic attack 08/24/2016   Family history of polycystic kidney disease 03/25/2016   Polycystic kidney disease 03/25/2016   Postpartum bleeding 09/07/2014   S/P D&C (status post dilation and curettage) 08/31/2014   Breast engorgement, postpartum 08/04/2014   Second-degree perineal  laceration, with delivery 08/04/2014   Allergy  to penicillin 08/02/2014   Hx of migraines 08/02/2014   Vaginal delivery 08/02/2014    Past Surgical History:  Procedure Laterality Date   CORONARY/GRAFT ACUTE MI REVASCULARIZATION N/A 12/09/2018   Procedure: CORONARY/GRAFT ACUTE MI REVASCULARIZATION;  Surgeon: Sammy Crisp, MD;  [facility];  Service: Cardiovascular;  Laterality: N/A;   DILATION AND CURETTAGE OF UTERUS  08/31/2014   Procedure: Dilatation and Currettage with Ultrasound Guidance;  Surgeon: Christel Cousins, DO;  Location: [facility];  Service: Gynecology;;   LEFT HEART CATH AND CORONARY ANGIOGRAPHY N/A 12/09/2018   Procedure: LEFT HEART CATH AND CORONARY ANGIOGRAPHY;  Surgeon: Sammy Crisp, MD;  [facility];  Service: Cardiovascular;  Laterality: N/A;   WISDOM TOOTH EXTRACTION      OB History     Gravida  2   Para  2   Term  2   Preterm      AB      Living  2      SAB      IAB      Ectopic      Multiple      Live Births  2            Home Medications    Prior to Admission medications   Medication Sig Start Date End Date Taking? Authorizing Provider  metoprolol  tartrate (LOPRESSOR ) 25 MG tablet Take  1 tablet (25 mg total) by mouth 2 (two) times daily. 11/03/23   Nahser, Lela Purple, MD  Multiple Vitamin (MULTIVITAMIN WITH MINERALS) TABS tablet Take 1 tablet by mouth daily.    [provider]  nitroGLYCERIN  (NITROSTAT ) 0.4 MG SL tablet Place 1 tablet (0.4 mg total) under the tongue every 5 (five) minutes as needed for chest pain. 06/25/20   Lucendia Rusk, MD    Family History Family History  Problem Relation Age of Onset   Thyroid disease Mother    Allergic rhinitis Father    Diabetes Father    Kidney disease Father        transplant   Breast cancer Maternal Grandmother    Heart disease Maternal Grandfather    Cancer Maternal Grandfather        testicular    Social History Social History    Tobacco Use   Smoking status: Never    Passive exposure: Never   Smokeless tobacco: Never  Vaping Use   Vaping status: Never Used  Substance Use Topics   Alcohol use: No   Drug use: No     Allergies   Iodinated contrast media, Loratadine, Morphine  and codeine, and Penicillins   Review of Systems Review of Systems Per HPI  Physical Exam Triage Vital Signs ED Triage Vitals  Encounter Vitals Group     BP 04/03/24 1906 131/87     Girls Systolic BP Percentile --      Girls Diastolic BP Percentile --      Boys Systolic BP Percentile --      Boys Diastolic BP Percentile --      Pulse Rate 04/03/24 1906 87     Resp 04/03/24 1906 17     Temp 04/03/24 1906 98.7 F (37.1 C)     Temp Source 04/03/24 1906 Oral     SpO2 04/03/24 1906 98 %     Weight --      Height --      Head Circumference --      Peak Flow --      Pain Score 04/03/24 1903 4     Pain Loc --      Pain Education --      Exclude from Growth Chart --    No data found.  Updated Vital Signs BP 131/87 (BP Location: Right Arm)   Pulse 87   Temp 98.7 F (37.1 C) (Oral)   Resp 17   LMP 03/06/2024 (Exact Date)   SpO2 98%   Breastfeeding No   Visual Acuity Right Eye Distance:   Left Eye Distance:   Bilateral Distance:    Right Eye Near:   Left Eye Near:    Bilateral Near:     Physical Exam Vitals and nursing note reviewed.  Constitutional:      Appearance: She is not ill-appearing or toxic-appearing.  HENT:     Head: Normocephalic and atraumatic.     Right Ear: Hearing and external ear normal.     Left Ear: Hearing and external ear normal.     Nose: Nose normal.     Mouth/Throat:     Lips: Pink.   Eyes:     General: Lids are normal. Vision grossly intact. Gaze aligned appropriately.     Extraocular Movements: Extraocular movements intact.     Conjunctiva/sclera: Conjunctivae normal.   Pulmonary:     Effort: Pulmonary effort is normal.   Musculoskeletal:        General: Signs of injury  present.     Cervical back: Neck supple.     Right foot: Normal range of motion and normal capillary refill. Laceration (Superficial laceration to the lateral nail fold of the left great toe as seen in image below.) and tenderness (secondary to laceration) present. No swelling, deformity, bunion, Charcot foot, foot drop, prominent metatarsal heads, bony tenderness or crepitus. Normal pulse.     Comments: +2 left dorsalis pedis pulse, less than 2 cap refill to distal left great toe, sensation intact distally, moves all 5 digits of left foot voluntarily. See image of laceration below.   Skin:    General: Skin is warm and dry.     Capillary Refill: Capillary refill takes less than 2 seconds.     Findings: No rash.   Neurological:     General: No focal deficit present.     Mental Status: She is alert and oriented to person, place, and time. Mental status is at baseline.     Cranial Nerves: No dysarthria or facial asymmetry.   Psychiatric:        Mood and Affect: Mood normal.        Speech: Speech normal.        Behavior: Behavior normal.        Thought Content: Thought content normal.        Judgment: Judgment normal.    Left great toe laceration   UC Treatments / Results  Labs (all labs ordered are listed, but only abnormal results are displayed) Labs Reviewed - No data to display  EKG   Radiology No results found.  Procedures Procedures (including critical care time)  Medications Ordered in UC Medications  Tdap (BOOSTRIX) injection 0.5 mL (0.5 mLs Intramuscular Given 04/03/24 1955)  bacitracin ointment (1 Application Topical Given 04/03/24 2001)    Initial Impression / Assessment and Plan / UC Course  I have reviewed the triage vital signs and the nursing notes.  Pertinent labs & imaging results that were available during my care of the patient were reviewed by me and considered in my medical decision making (see chart for details).   1. Laceration of left great toe  without foreign body present or damage to nail, need for tetanus booster Laceration with delayed presentation, no repair performed. Laceration cleaned with shur-cleans soak. Low suspicion for acute bony abnormality, therefore deferred imaging of the left foot given no damage to the nail of affected toe. Dressed in clinic with bacitracin and non-stick gauze with coban by clinical team prior to discharge. Tdap updated. Wound care and infection return precautions discussed. May use Aquaphor to the wound BID to promote healing.  Counseled patient on potential for adverse effects with medications prescribed/recommended today, strict ER and return-to-clinic precautions discussed, patient verbalized understanding.    Final Clinical Impressions(s) / UC Diagnoses   Final diagnoses:  Laceration of left great toe without foreign body present or damage to nail, initial encounter  Need for tetanus booster     Discharge Instructions      We updated your tetanus injection today. We applied a dressing in the clinic tonight to your wound.  You may keep this on tonight, then take off in the morning. Keep wound covered during the day with Aquaphor ointment and a bandage. Keep wound open to air at nighttime to allow to breathe.  Watch for signs of infection such as redness, swelling, pus, pain, and streaking redness to the foot.  If you develop any new or worsening symptoms or if  your symptoms do not start to improve, please return here or follow-up with your primary care provider. If your symptoms are severe, please go to the emergency room.    ED Prescriptions   None    PDMP not reviewed this encounter.   Starlene Eaton, [place] 04/03/24 2123

## 2024-04-03 NOTE — ED Triage Notes (Addendum)
 Pt present with a laceration to the lt great and second toe. Pt states a metal piece of a door at work cut the side of her toe. It happened yesterday and states it was bleeding a lot. Pt states she is not up to date on her tetanus vaccine.

## 2024-04-06 ENCOUNTER — Encounter: Payer: Self-pay | Admitting: Cardiovascular Disease

## 2024-09-27 ENCOUNTER — Encounter (HOSPITAL_BASED_OUTPATIENT_CLINIC_OR_DEPARTMENT_OTHER): Payer: Self-pay | Admitting: Cardiovascular Disease

## 2024-11-21 ENCOUNTER — Other Ambulatory Visit: Payer: Self-pay | Admitting: Physician Assistant

## 2024-11-21 MED ORDER — METOPROLOL TARTRATE 25 MG PO TABS: 25.0000 mg | ORAL_TABLET | Freq: Two times a day (BID) | ORAL | 0 refills | Status: AC

## 2024-11-21 NOTE — Telephone Encounter (Signed)
 Valid encounter within last 12 months Pending appt 02/01/25 w Dr Raford

## 2025-02-01 ENCOUNTER — Ambulatory Visit (HOSPITAL_BASED_OUTPATIENT_CLINIC_OR_DEPARTMENT_OTHER): Admitting: Cardiovascular Disease
